# Patient Record
Sex: Female | Born: 1985 | Race: White | Hispanic: No | Marital: Married | Smoking: Current every day smoker
Health system: Southern US, Community
[De-identification: ages and names within clinical notes are randomized; demographics above are authoritative.]

## PROBLEM LIST (undated history)

## (undated) ENCOUNTER — Inpatient Hospital Stay: Payer: Self-pay

## (undated) DIAGNOSIS — G43909 Migraine, unspecified, not intractable, without status migrainosus: Secondary | ICD-10-CM

## (undated) DIAGNOSIS — K219 Gastro-esophageal reflux disease without esophagitis: Secondary | ICD-10-CM

## (undated) DIAGNOSIS — IMO0001 Reserved for inherently not codable concepts without codable children: Secondary | ICD-10-CM

## (undated) DIAGNOSIS — Z803 Family history of malignant neoplasm of breast: Secondary | ICD-10-CM

## (undated) DIAGNOSIS — Z8619 Personal history of other infectious and parasitic diseases: Secondary | ICD-10-CM

## (undated) DIAGNOSIS — O24419 Gestational diabetes mellitus in pregnancy, unspecified control: Secondary | ICD-10-CM

## (undated) DIAGNOSIS — Z8709 Personal history of other diseases of the respiratory system: Secondary | ICD-10-CM

## (undated) DIAGNOSIS — R42 Dizziness and giddiness: Secondary | ICD-10-CM

## (undated) HISTORY — DX: Migraine, unspecified, not intractable, without status migrainosus: G43.909

## (undated) HISTORY — DX: Gastro-esophageal reflux disease without esophagitis: K21.9

## (undated) HISTORY — DX: Reserved for inherently not codable concepts without codable children: IMO0001

## (undated) HISTORY — DX: Gestational diabetes mellitus in pregnancy, unspecified control: O24.419

## (undated) HISTORY — DX: Family history of malignant neoplasm of breast: Z80.3

## (undated) HISTORY — PX: LAPAROSCOPY: SHX197

## (undated) HISTORY — PX: DILATION AND CURETTAGE OF UTERUS: SHX78

---

## 2012-03-11 ENCOUNTER — Ambulatory Visit: Payer: Self-pay | Admitting: Obstetrics and Gynecology

## 2012-03-11 LAB — COMPREHENSIVE METABOLIC PANEL
Alkaline Phosphatase: 67 U/L (ref 50–136)
Bilirubin,Total: 0.3 mg/dL (ref 0.2–1.0)
Chloride: 106 mmol/L (ref 98–107)
Co2: 28 mmol/L (ref 21–32)
Creatinine: 0.62 mg/dL (ref 0.60–1.30)
EGFR (African American): >60
EGFR (Non-African Amer.): >60
Osmolality: 279 (ref 275–301)
SGOT(AST): 18 U/L (ref 15–37)
SGPT (ALT): 19 U/L (ref 12–78)
Sodium: 141 mmol/L (ref 136–145)

## 2012-03-11 LAB — CBC
HCT: 38.6 % (ref 35.0–47.0)
MCHC: 33.6 g/dL (ref 32.0–36.0)
Platelet: 283 10*3/uL (ref 150–440)
RDW: 14.3 % (ref 11.5–14.5)
WBC: 12.6 10*3/uL — ABNORMAL HIGH (ref 3.6–11.0)

## 2012-03-11 LAB — PREGNANCY, URINE: Pregnancy Test, Urine: NEGATIVE m[IU]/mL

## 2012-03-14 ENCOUNTER — Ambulatory Visit: Payer: Self-pay | Admitting: Obstetrics and Gynecology

## 2012-03-18 LAB — PATHOLOGY REPORT

## 2013-03-25 ENCOUNTER — Ambulatory Visit: Payer: Self-pay | Admitting: Obstetrics & Gynecology

## 2013-03-25 LAB — CBC
HGB: 13.1 g/dL (ref 12.0–16.0)
MCH: 31.1 pg (ref 26.0–34.0)
MCV: 91 fL (ref 80–100)
Platelet: 286 10*3/uL (ref 150–440)
RBC: 4.21 10*6/uL (ref 3.80–5.20)
RDW: 14.3 % (ref 11.5–14.5)
WBC: 12.9 10*3/uL — ABNORMAL HIGH (ref 3.6–11.0)

## 2013-03-25 LAB — COMPREHENSIVE METABOLIC PANEL
Albumin: 4.2 g/dL (ref 3.4–5.0)
Alkaline Phosphatase: 68 U/L (ref 50–136)
Anion Gap: 5 — ABNORMAL LOW (ref 7–16)
Bilirubin,Total: 0.4 mg/dL (ref 0.2–1.0)
Calcium, Total: 9.3 mg/dL (ref 8.5–10.1)
Co2: 26 mmol/L (ref 21–32)
Creatinine: 0.57 mg/dL — ABNORMAL LOW (ref 0.60–1.30)
EGFR (African American): >60
Glucose: 106 mg/dL — ABNORMAL HIGH (ref 65–99)
SGPT (ALT): 25 U/L (ref 12–78)
Sodium: 138 mmol/L (ref 136–145)
Total Protein: 7.7 g/dL (ref 6.4–8.2)

## 2013-03-27 LAB — PATHOLOGY REPORT

## 2013-06-11 HISTORY — PX: DILATION AND CURETTAGE OF UTERUS: SHX78
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
## 2014-09-28 NOTE — Op Note (Signed)
PATIENT NAME:  [name], [name] MR#:  [id number] DATE OF BIRTH:  07/11/1985  DATE OF PROCEDURE:  03/14/2012  PREOPERATIVE DIAGNOSES:  1. Abnormal uterine bleeding.  2. Chronic pelvic pain.   POSTOPERATIVE DIAGNOSIS:  1. Abnormal uterine bleeding.  2. Chronic pelvic pain.   PROCEDURES:  1. Diagnostic laparoscopy.  2. Dilation and curettage.  3. Hysteroscopy.   ANESTHESIA: General LMA.   SURGEON:  Stephen Jackson, M.D.   ESTIMATED BLOOD LOSS: Minimal.   OPERATIVE FLUIDS: 1000 mL crystalloid.   COMPLICATIONS: None.   FINDINGS:  1. Small cyst on left ovary, simple-appearing.  2. Small amount of scar tissue in cul-de-sac, potentially suggestive of endometriosis.  3. Small lesion on right ovary, powder burn appearance.  4. Normal-appearing uterine cavity with several very small polypoid lesions.   SPECIMENS:  1. Endometrial curettings.  2. Biopsy of pouch of Douglas peritoneum.   CONDITION AT END OF THE PROCEDURE: Stable.   INDICATIONS FOR PROCEDURE: [name] [name] is a 29-year-old female who presented to me in the clinic after being initially evaluated by Dr. Van Dalen for chronic pelvic pain as well as abnormal uterine bleeding. She underwent a saline infusion sonohysterogram which revealed several filling defects in the uterus which were suggestive of the polyps. With her history of pelvic pain and potentially polyps in her uterus, it was recommended that she undergo and a diagnostic laparoscopy as well as hysteroscopy with a dilation and curettage. She was sent to me by Dr. Van Dalen. I reviewed her history up to that point and discussed the recommended treatment. She was therefore taken to the operating room.   PROCEDURE IN DETAIL: After the patient was met in the preoperative area, she was taken to the operating room. She was placed under general anesthesia which was found to be adequate. She was prepped and draped in the usual sterile fashion in the dorsal supine lithotomy  position. After time-out was called, a sterile speculum was placed in the vagina and a single-tooth tenaculum was affixed to the anterior lip of the cervix. An acorn uterine manipulator was then affixed to the tenaculum. A Foley catheter was placed in her bladder.   Attention was turned to the abdomen where a 5-mm infraumbilical incision was made and the abdomen was entered using the Optiview direct trocar entry method. Entry was verified in the abdomen using opening abdominal pressure. The abdomen was then insufflated with CO2. After insufflation, the camera was reintroduced and atraumatic entry was verified. The abdomen and pelvis were surveyed with the above-noted findings. A peritoneal biopsy was taken through a left lower quadrant 5-mm port, which was also placed under direct intraabdominal camera visualization. After the biopsy there was hemostasis. The diagnostic laparoscopy portion was then concluded after the ports were removed. The abdomen was desufflated and the incisions were closed using a 3-0 Monocryl in a subcuticular fashion followed by Dermabond which reapproximated the skin.   Attention was turned to the hysteroscopy portion of the procedure. The uterine manipulator was removed and the cervix was dilated in a serial fashion using Hegar dilators to 8 mm. The hysteroscope was gently introduced through the cervix into the uterus with the above-noted findings. Curette was then used to sample the lining of the uterus. The hysteroscope was then reintroduced through the cervix to verify that no damage was done. The camera was then removed and the single-tooth tenaculum was removed from the anterior lip of the cervix with hemostasis noted. The catheter was also removed from her bladder.     The patient tolerated the procedure well. Sponge, lap, and needle counts were correct times two. The patient was awakened in the operating room and taken to the recovery area in stable fashion.      ____________________________ Stephen D. Jackson, MD sdj:bjt D: 03/15/2012 06:02:22 ET T: 03/15/2012 08:49:28 ET JOB#: [id number]  cc: Stephen D. Jackson, MD, <Dictator> STEPHEN D JACKSON MD ELECTRONICALLY SIGNED 04/16/2012 4:39 

## 2014-10-01 NOTE — Op Note (Signed)
PATIENT NAME:  [name] [name], [name] [name] MR#:  [id number] DATE OF BIRTH:  [birth year]  DATE OF PROCEDURE:  03/25/2013  PREOPERATIVE DIAGNOSIS: Left ectopic pregnancy.   POSTOPERATIVE DIAGNOSIS: Left ectopic pregnancy.   PROCEDURE: Operative laparoscopy with left salpingostomy.   SURGEON: Annamarie MajorPaul Tishawn Friedhoff, M.D.   ANESTHESIA: General.   ESTIMATED BLOOD LOSS: Minimal.   COMPLICATIONS: None.   FINDINGS: A left ampullary ectopic pregnancy, unruptured.   DISPOSITION: To recovery room stable.   TECHNIQUE: The patient is prepped and draped in the usual sterile fashion after adequate anesthesia is obtained in the dorsal lithotomy position. Bladder is drained with a Foley catheter and a sponge stick is placed in the vagina for manipulation purposes as needed.   Attention is then turned to the abdomen where a Veress needle is inserted through a 5 mm infraumbilical incision after Marcaine is used to anesthetize the skin. Veress needle placement is confirmed using the hanging drop technique and the abdomen is then insufflated with CO2 gas. A 5 mm trocar is inserted under direct visualization with the laparoscope, with no injuries or bleeding noted. The patient is placed in Trendelenburg positioning. A 5 mm trocar is placed in the right lower quadrant lateral to the inferior epigastric blood vessels, with no injuries or bleeding noted. The above-mentioned findings are visualized and the ampullary portion of the pelvic pregnancy carefully incised in a linear fashion with the fallopian tube's axis, and then removal of products of conception is performed with grasping forceps as well as suction irrigator. The area is then irrigated with repeat aspiration and excellent hemostasis is noted. The right fallopian tube is visualized to be completely normal in its entire length and the ovaries and uterus are also normal. There is no evidence for endometriosis visualized at this time. The liver, gallbladder, bowel, colon and ureter  are all observed to be normal and unharmed throughout the procedure.   Fluid is aspirated and then gas is expelled and then trocars are removed. The skin is closed with Dermabond. Catheter and sponge stick are removed from the vagina. The patient goes to the recovery room in stable condition. All sponge, instrument and needle counts are correct.    ____________________________ R. Annamarie MajorPaul Gaberiel Youngblood, MD rph:jm D: 03/25/2013 14:45:50 ET T: 03/25/2013 15:24:42 ET JOB#: [id number]  cc: Dierdre Searles. Paul Alondria Mousseau, MD, <Dictator> Nadara MustardOBERT P Arnie Maiolo MD ELECTRONICALLY SIGNED 03/26/2013 8:52

## 2014-10-25 ENCOUNTER — Encounter: Payer: 59 | Attending: Obstetrics and Gynecology | Admitting: Dietician

## 2014-10-25 ENCOUNTER — Encounter: Payer: Self-pay | Admitting: Dietician

## 2014-10-25 VITALS — BP 96/60 | Ht 64.0 in | Wt 139.7 lb

## 2014-10-25 DIAGNOSIS — O24410 Gestational diabetes mellitus in pregnancy, diet controlled: Secondary | ICD-10-CM | POA: Diagnosis not present

## 2014-10-25 NOTE — Progress Notes (Signed)
Diabetes Self-Management Education  Visit Type:    Appt. Start Time: 11am Appt. End Time: 12:30pm  10/25/2014  [name], identified by name and date of birth, is a 29 y.o. female with a diagnosis of Diabetes: Gestational Diabetes.  Other people present during visit:      ASSESSMENT  Blood pressure 96/60, height 5\' 4"  (1.626 m), weight 139 lb 11.2 oz (63.368 kg), last menstrual period 03/14/2014. Body mass index is 23.97 kg/(m^2).  Initial Visit Information:  Are you currently following a meal plan?: No   Are you taking your medications as prescribed?: Yes Are you checking your feet?: No   How often do you need to have someone help you when you read instructions, pamphlets, or other written materials from your doctor or pharmacy?: 1 - Never What is the last grade level you completed in school?: 12  Psychosocial:     Patient Belief/Attitude about Diabetes: Motivated to manage diabetes Self-care barriers: None (work) Patient Concerns: Nutrition/Meal planning, Monitoring Special Needs: None Preferred Learning Style: Auditory, Visual Learning Readiness: Ready  Complications:   How often do you check your blood sugar?: 0 times/day (not testing) Have you had a dilated eye exam in the past 12 months?: No Have you had a dental exam in the past 12 months?: No  Diet Intake:  Breakfast: meal times vary 6a-10a-eats out frequently Snack (morning): none Lunch: meal time varies 11a-2p-eats sweets daily-eats out frequently Snack (afternoon): none Dinner: mealtimes varies 6p-10p-eats out frequently Snack (evening): none  Exercise:  Exercise:  (active job-unloading trucks)  Individualized Plan for Diabetes Self-Management Training:   Learning Objective:  Patient will have a greater understanding of diabetes self-management.  Patient education plan per assessed needs and concerns is to attend individual sessions for     Education Topics Reviewed with Patient  Today:  Factors that contribute to the development of diabetes, Explored patient's options for treatment of their diabetes (definition of GDM) Role of diet in the treatment of diabetes and the relationship between the three main macronutrients and blood glucose level (portion sizes) Role of exercise on diabetes management, blood pressure control and cardiac health.  (discussed med options for treatment of GDM) Taught/evaluated SMBG meter., Purpose and frequency of SMBG., Taught/discussed recording of test results and interpretation of SMBG. Taught treatment of hypoglycemia - the 15 rule. Relationship between chronic complications and blood glucose control Role of stress on diabetes Reviewed with patient blood glucose goals with pregnancy, Pregnancy and GDM  Role of pre-pregnancy blood glucose control on the development of the fetus Lifestyle issues that need to be addressed for better diabetes care, Review risk of smoking and offered smoking cessation, Helped patient develop diabetes management plan for (enter comment)  PATIENTS GOALS/Plan (Developed by the patient):  Nutrition: Follow meal plan discussed, General guidelines for healthy choices and portions discussed Physical Activity: Exercise 5-7 days per week Medications: take my medication as prescribed Monitoring : test my blood glucose as discussed (note x per day with comment), test blood glucose pre and post meals as discussed Reducing Risk: stop smoking  Plan:   Patient Instructions  Read book on Gestational Diabetes Follow Gestational Meal Planning Sheet Complete a 3 day Food Record and bring to next appointment Check blood sugars first thing in the morning before eating and 2 hrs after every meal daily Call MD for prescription for meter strips and lancets Check blood sugars   4  times/day Strips (type)  One touch verio test strips; Lancets  One  touch delica lancets Stop smoking Exercise 20-30 minutes at least 5 x week Call  if questions [phone] Next appointment   11-02-14 at 9am   Expected Outcomes:  Demonstrated interest in learning. Expect positive outcomes  Education material provided: GDM booklet; One touch Verio meter  If problems or questions, patient to contact team via: [phone]  Future DSME appointment:  (11-02-14)

## 2014-10-25 NOTE — Patient Instructions (Signed)
Read book on Gestational Diabetes Follow Gestational Meal Planning Sheet Complete a 3 day Food Record and bring to next appointment Check blood sugars first thing in the morning before eating and 2 hrs after every meal daily Call MD for prescription for meter strips and lancets Check blood sugars   4  times/day Strips (type)  One touch verio test strips; Lancets  One touch delica lancets Stop smoking Exercise 20-30 minutes at least 5 x week Call if questions [phone] Next appointment   11-02-14 at [facility]

## 2014-11-02 ENCOUNTER — Encounter: Payer: 59 | Admitting: Dietician

## 2014-11-02 VITALS — BP 110/70 | Ht 64.0 in | Wt 137.2 lb

## 2014-11-02 DIAGNOSIS — O24410 Gestational diabetes mellitus in pregnancy, diet controlled: Secondary | ICD-10-CM

## 2014-11-02 NOTE — Patient Instructions (Signed)
Make sure to include a protein source with all meals. Avoid skipping meals (dinner). Call the [facility] with any questions or concerns.

## 2014-11-02 NOTE — Progress Notes (Signed)
Appt. Start Time: 0900 Appt. End Time: 1000  Patient's BG record indicates BGs well within goal ranges; 2 readings <70, but pt denies any low BG symptoms. Patient's food diary indicates some skipped meals due to work, fatigue. Some meals lack protein source.  Provided 1950kcal meal plan, and wrote individualized menus based on patient's food preferences. Instructed patient on food safety, including avoidance of Listeriosis, and limiting mercury from fish. Discussed importance of maintaining healthy lifestyle habits to reduce risk of Type 2 DM as well as Gestational DM with any future pregnancies. Advised patient to use any remaining testing supplies to test some BGs after delivery, and to have BG tested ideally annually, as well as prior to attempting future pregnancies.  GDM Class 2 Diabetes Overview - define DM; state own type of DM; identify functions of pancreas and insulin; define insulin deficiency vs insulin resistance  Psychosocial - identify DM as a source of stress; state the effects of stress on BG control; verbalize appropriate stress management techniques; identify personal stress issues   Nutritional Management - describe effects of food on blood glucose; identify sources of carbohydrate, protein and fat; verbalize the importance of balance meals in controlling blood glucose; identify meals as well balanced or not; estimate servings of carbohydrate from menus; use food labels to identify servings size, content of carbohydrate, fiber, protein, fat, saturated fat and sodium; recognize food sources of fat, saturated fat, trans fat, sodium and verbalize goals for intake; describe healthful appropriate food choices when dining out   Exercise - describe the effects of exercise on blood glucose and importance of regular exercise in controlling diabetes; state a plan for personal exercise; verbalize contraindications for exercise  Medications - state name, dose, timing of currently prescribed  medications; describe types of medications available for diabetes  Self-Monitoring - state importance of HBGM and demo procedure accurately; use HBGM results to effectively manage diabetes; identify importance of regular HbA1C testing and goals for results  Acute Complications/Sick Day Guidelines - recognize hyperglycemia and hypoglycemia with causes and effects; identify blood glucose results as high, low or in control; list steps in treating and preventing high and low blood glucose; state appropriate measure to manage blood glucose when ill (need for meds, HBGM plan, when to call physician, need for fluids)  Chronic Complications/Foot, Skin, Eye Dental Care - identify possible long-term complications of diabetes (retinopathy, neuropathy, nephropathy, cardiovascular disease, infections); explain steps in prevention and treatment of chronic complications; state importance of daily self-foot exams; describe how to examine feet and what to look for; explain appropriate eye and dental care  Lifestyle Changes/Goals & Health/Community Resources - state benefits of making appropriate lifestyle changes; identify habits that need to change (meals, tobacco, alcohol); identify strategies to reduce risk factors for personal health; set goals for proper diabetes care; state need for and frequency of healthcare follow-up; describe appropriate community resources for good health (ADA, web sites, apps)   Pregnancy/Sexual Health - define gestational diabetes; state importance of good blood glucose control and birth control prior to pregnancy; state importance of good blood glucose control in preventing sexual problems (impotence, vaginal dryness, infections, loss of desire); state relationship of blood glucose control and pregnancy outcome; describe risk of maternal and fetal complications  Teaching Materials Used: Meal Plan Food Lists Food Safety Preventing Diabetes

## 2014-12-09 ENCOUNTER — Inpatient Hospital Stay
Admission: EM | Admit: 2014-12-09 | Discharge: 2014-12-11 | DRG: 775 | Disposition: A | Discharge status: Home / Self Care | Payer: 59 | Attending: Obstetrics and Gynecology | Admitting: Obstetrics and Gynecology

## 2014-12-09 DIAGNOSIS — O24429 Gestational diabetes mellitus in childbirth, unspecified control: Secondary | ICD-10-CM | POA: Diagnosis present

## 2014-12-09 DIAGNOSIS — Z3A38 38 weeks gestation of pregnancy: Secondary | ICD-10-CM | POA: Diagnosis present

## 2014-12-09 DIAGNOSIS — O479 False labor, unspecified: Secondary | ICD-10-CM | POA: Diagnosis present

## 2014-12-09 LAB — CBC
HCT: 35.3 % (ref 35.0–47.0)
HCT: 35.6 % (ref 35.0–47.0)
HEMOGLOBIN: 11.6 g/dL — AB (ref 12.0–16.0)
Hemoglobin: 11.4 g/dL — ABNORMAL LOW (ref 12.0–16.0)
MCH: 27.9 pg (ref 26.0–34.0)
MCH: 28.2 pg (ref 26.0–34.0)
MCHC: 32.3 g/dL (ref 32.0–36.0)
MCHC: 32.6 g/dL (ref 32.0–36.0)
MCV: 86.5 fL (ref 80.0–100.0)
MCV: 86.7 fL (ref 80.0–100.0)
PLATELETS: 291 10*3/uL (ref 150–440)
PLATELETS: 297 10*3/uL (ref 150–440)
RBC: 4.09 MIL/uL (ref 3.80–5.20)
RBC: 4.1 MIL/uL (ref 3.80–5.20)
RDW: 13.4 % (ref 11.5–14.5)
RDW: 13.4 % (ref 11.5–14.5)
WBC: 19 10*3/uL — AB (ref 3.6–11.0)
WBC: 20.5 10*3/uL — ABNORMAL HIGH (ref 3.6–11.0)

## 2014-12-09 LAB — RAPID HIV SCREEN (HIV 1/2 AB+AG)
HIV 1/2 Antibodies: NONREACTIVE
HIV-1 P24 Antigen - HIV24: NONREACTIVE

## 2014-12-09 MED ORDER — IBUPROFEN 600 MG PO TABS
600.0000 mg | ORAL_TABLET | Freq: Four times a day (QID) | ORAL | Status: DC
  Administered 2014-12-09 – 2014-12-11 (×8): 600 mg via ORAL
  Filled 2014-12-09 (×8): qty 1

## 2014-12-09 MED ORDER — FERROUS SULFATE 325 (65 FE) MG PO TABS
325.0000 mg | ORAL_TABLET | Freq: Every day | ORAL | Status: DC
  Administered 2014-12-10 – 2014-12-11 (×2): 325 mg via ORAL
  Filled 2014-12-09 (×2): qty 1

## 2014-12-09 MED ORDER — BENZOCAINE-MENTHOL 20-0.5 % EX AERO: 1.0000 "application " | INHALATION_SPRAY | CUTANEOUS | Status: DC | PRN

## 2014-12-09 MED ORDER — PRENATAL 27-0.8 MG PO TABS: 1.0000 | ORAL_TABLET | Freq: Every day | ORAL | Status: DC

## 2014-12-09 MED ORDER — BENZOCAINE-MENTHOL 20-0.5 % EX AERO
1.0000 "application " | INHALATION_SPRAY | CUTANEOUS | Status: DC | PRN
  Filled 2014-12-09: qty 56

## 2014-12-09 MED ORDER — DOCUSATE SODIUM 100 MG PO CAPS
100.0000 mg | ORAL_CAPSULE | Freq: Two times a day (BID) | ORAL | Status: DC
  Administered 2014-12-09 – 2014-12-11 (×5): 100 mg via ORAL
  Filled 2014-12-09 (×4): qty 1

## 2014-12-09 MED ORDER — ZOLPIDEM TARTRATE 5 MG PO TABS
5.0000 mg | ORAL_TABLET | Freq: Every evening | ORAL | Status: DC | PRN
Start: 2014-12-09 — End: 2014-12-11

## 2014-12-09 MED ORDER — DOCUSATE SODIUM 100 MG PO CAPS: 100.0000 mg | ORAL_CAPSULE | Freq: Two times a day (BID) | ORAL | Status: DC

## 2014-12-09 MED ORDER — ONDANSETRON HCL 4 MG/2ML IJ SOLN: 4.0000 mg | INTRAMUSCULAR | Status: DC | PRN

## 2014-12-09 MED ORDER — PANTOPRAZOLE SODIUM 40 MG PO TBEC
40.0000 mg | DELAYED_RELEASE_TABLET | Freq: Every day | ORAL | Status: DC
  Administered 2014-12-10 – 2014-12-11 (×2): 40 mg via ORAL
  Filled 2014-12-09 (×2): qty 1

## 2014-12-09 MED ORDER — IBUPROFEN 600 MG PO TABS
ORAL_TABLET | ORAL | Status: AC
  Administered 2014-12-09: 600 mg via ORAL
  Filled 2014-12-09: qty 1

## 2014-12-09 MED ORDER — OXYCODONE-ACETAMINOPHEN 5-325 MG PO TABS
2.0000 | ORAL_TABLET | ORAL | Status: DC | PRN
  Administered 2014-12-10 – 2014-12-11 (×4): 2 via ORAL
  Filled 2014-12-09 (×5): qty 2

## 2014-12-09 MED ORDER — DIBUCAINE 1 % RE OINT: 1.0000 "application " | TOPICAL_OINTMENT | RECTAL | Status: DC | PRN

## 2014-12-09 MED ORDER — LANOLIN HYDROUS EX OINT: TOPICAL_OINTMENT | CUTANEOUS | Status: DC | PRN

## 2014-12-09 MED ORDER — OXYCODONE-ACETAMINOPHEN 5-325 MG PO TABS
1.0000 | ORAL_TABLET | ORAL | Status: DC | PRN
  Administered 2014-12-09 – 2014-12-10 (×5): 1 via ORAL
  Filled 2014-12-09 (×4): qty 1

## 2014-12-09 MED ORDER — ACETAMINOPHEN 325 MG PO TABS: 650.0000 mg | ORAL_TABLET | ORAL | Status: DC | PRN

## 2014-12-09 MED ORDER — OXYTOCIN 10 UNIT/ML IJ SOLN
10.0000 [IU] | Freq: Once | INTRAMUSCULAR | Status: AC
  Administered 2014-12-09: 10 [IU] via INTRAMUSCULAR

## 2014-12-09 MED ORDER — ONDANSETRON HCL 4 MG PO TABS
4.0000 mg | ORAL_TABLET | ORAL | Status: DC | PRN
Start: 2014-12-09 — End: 2014-12-11

## 2014-12-09 MED ORDER — MAGNESIUM HYDROXIDE 400 MG/5ML PO SUSP: 30.0000 mL | ORAL | Status: DC | PRN

## 2014-12-09 MED ORDER — OXYTOCIN 10 UNIT/ML IJ SOLN
10.0000 [IU] | Freq: Once | INTRAMUSCULAR | Status: DC
  Administered 2014-12-09: 10 [IU] via INTRAMUSCULAR

## 2014-12-09 MED ORDER — DIPHENHYDRAMINE HCL 25 MG PO CAPS: 25.0000 mg | ORAL_CAPSULE | Freq: Four times a day (QID) | ORAL | Status: DC | PRN

## 2014-12-09 MED ORDER — PRENATAL MULTIVITAMIN CH
1.0000 | ORAL_TABLET | Freq: Every day | ORAL | Status: DC
  Administered 2014-12-09 – 2014-12-11 (×3): 1 via ORAL
  Filled 2014-12-09 (×3): qty 1

## 2014-12-09 MED ORDER — WITCH HAZEL-GLYCERIN EX PADS: 1.0000 "application " | MEDICATED_PAD | CUTANEOUS | Status: DC | PRN

## 2014-12-09 MED ORDER — LACTATED RINGERS IV SOLN: INTRAVENOUS | Status: DC

## 2014-12-09 MED ORDER — IBUPROFEN 600 MG PO TABS: 600.0000 mg | ORAL_TABLET | Freq: Four times a day (QID) | ORAL | Status: DC

## 2014-12-09 MED ORDER — SIMETHICONE 80 MG PO CHEW: 80.0000 mg | CHEWABLE_TABLET | ORAL | Status: DC | PRN

## 2014-12-09 MED ORDER — PRENATAL MULTIVITAMIN CH: 1.0000 | ORAL_TABLET | Freq: Every day | ORAL | Status: DC

## 2014-12-09 MED ORDER — TETANUS-DIPHTH-ACELL PERTUSSIS 5-2.5-18.5 LF-MCG/0.5 IM SUSP
0.5000 mL | Freq: Once | INTRAMUSCULAR | Status: AC
  Administered 2014-12-11: 0.5 mL via INTRAMUSCULAR
  Filled 2014-12-09: qty 0.5

## 2014-12-09 MED ORDER — MORPHINE SULFATE 10 MG/ML IJ SOLN
INTRAMUSCULAR | Status: AC
  Administered 2014-12-09: 10 mg via INTRAMUSCULAR
  Filled 2014-12-09: qty 1

## 2014-12-09 MED ORDER — OXYCODONE-ACETAMINOPHEN 5-325 MG PO TABS: 1.0000 | ORAL_TABLET | ORAL | Status: DC | PRN

## 2014-12-09 MED ORDER — IBUPROFEN 600 MG PO TABS
600.0000 mg | ORAL_TABLET | Freq: Four times a day (QID) | ORAL | Status: DC
  Administered 2014-12-09: 600 mg via ORAL

## 2014-12-09 NOTE — H&P (Signed)
Obstetric History and Physical  [name] [name] is a 29 y.o. G2P0010 with Estimated Date of Delivery: 12/19/14 per LMP and 7 wk US who presents at 1215w4d  presenting for advanced labor. Pt was 8 cm on admission and quickly moved to complete dilation. Patient states she has been having regular contractions, no vaginal bleeding, intact membranes, with active fetal movement.    Prenatal Course Source of Care: [facility]  with onset of care at 7 weeks Pregnancy complications or risks:There are no active problems to display for this patient.    Prenatal labs and studies: ABO, Rh: O+  Antibody: neg Rubella: Immune Varicella: Immune RPR:  NR HBsAg:  neg HIV: neg GC/CT: neg/neg GBS: neg 1 hr Glucola: 206   Genetic screening: 1st trimester screen negative    Prenatal Transfer Tool   Past Medical History  Diagnosis Date  . Migraines   . Reflux     PSHx: Laparoscopy, hysteroscopy, D&C, oral surgery  OBHx: G2P0010 - h/o ectopic with salpingectomy   History   Social History  . Marital Status: Single    Spouse Name: N/A  . Number of Children: N/A  . Years of Education: N/A   Social History Main Topics  . Smoking status: Current Every Day Smoker -- 15 years  . Smokeless tobacco: Not on file     Comment: trying to quit-smokes 1-3 cigarettes/day  . Alcohol Use: No  . Drug Use: No  . Sexual Activity: Not on file   Other Topics Concern  . Not on file   Social History Narrative  . No narrative on file    No family history on file.  Prescriptions prior to admission  Medication Sig Dispense Refill Last Dose  . acetaminophen (TYLENOL) 325 MG tablet Take 650 mg by mouth every 6 (six) hours as needed for mild pain or moderate pain.   Taking  . omeprazole (PRILOSEC) 20 MG capsule Take 20 mg by mouth daily.   Taking  . ONETOUCH DELICA LANCETS FINE MISC   1 Taking  . ONETOUCH VERIO test strip   1 Taking  . Prenatal Vit-Fe Fumarate-FA (MULTIVITAMIN-PRENATAL) 27-0.8 MG TABS tablet  Take 1 tablet by mouth daily at 12 noon.   Taking    Allergies  Allergen Reactions  . Latex Rash  . Sulfa Antibiotics Rash    Review of Systems: Negative except for what is mentioned in HPI.  Physical Exam: LMP 03/14/2014 GENERAL: Well-developed, well-nourished female in no acute distress.  ABDOMEN: Soft, nontender, nondistended, gravid. EXTREMITIES: Nontender, no edema Cervical Exam: Dilatation 8 cm   Effacement 100 %   Station 0   Presentation: cephalic FHT: + FHTs on admission, delivery shortly after   Pertinent Labs/Studies:   No results found for this or any previous visit (from the past 24 hour(s)).  Assessment : IUP at 8615w4d in advanced labor   Plan: Admit for labor and delivery See delivery note

## 2014-12-09 NOTE — Progress Notes (Signed)
OB Note CTSP to repair her lacerations. Patient had uncomplicated SVD and on inspection she had b/l labia majora tears and a 1st degree perineal laceration. After injection of 20mL of 1% lidocaine into the three areas, the bilateral l. Majora tears were repaired with 3-0 vicryl in a running non locking fashion. The 1st degree perineal was then repaired with a 0-vicryl in a running non locking fashion.  See delivery of CNM Brothers for full details.   Cornelia Copaharlie Jaquail Mclees, Jr MD [facility]  Pager: [phone]

## 2014-12-10 LAB — RPR: RPR: NONREACTIVE

## 2014-12-10 NOTE — Progress Notes (Signed)
  Subjective:  Doing well, minimal lcohia  Objective:   Blood pressure 113/63, pulse 68, temperature 97.6 F (36.4 C), temperature source Oral, resp. rate 16, height 5\' 4"  (1.626 m), weight 60.782 kg (134 lb), last menstrual period 03/14/2014, SpO2 100 %.  General: NAD Pulmonary: no increased work of breathing Abdomen: non-distended, non-tender, fundus firm at level of umbilicus Extremities: no edema, no erythema, no tenderness  Results for orders placed or performed during the hospital encounter of 12/09/14 (from the past 72 hour(s))  RPR     Status: None   Collection Time: 12/09/14  8:36 AM  Result Value Ref Range   RPR Ser Ql Non Reactive Non Reactive    Comment: (NOTE) Performed At: [facility] [place] [address] [place], [place] [id number] Mila HomerHancock William F MD [phone]   CBC     Status: Abnormal   Collection Time: 12/09/14  8:36 AM  Result Value Ref Range   WBC 19.0 (H) 3.6 - 11.0 K/uL   RBC 4.09 3.80 - 5.20 MIL/uL   Hemoglobin 11.4 (L) 12.0 - 16.0 g/dL   HCT 95.635.3 21.335.0 - 08.647.0 %   MCV 86.5 80.0 - 100.0 fL   MCH 27.9 26.0 - 34.0 pg   MCHC 32.3 32.0 - 36.0 g/dL   RDW 57.813.4 46.911.5 - 62.914.5 %   Platelets 291 150 - 440 K/uL  Rapid HIV screen (HIV 1/2 Ab+Ag)     Status: None   Collection Time: 12/09/14  8:36 AM  Result Value Ref Range   HIV-1 P24 Antigen - HIV24 NON REACTIVE NON REACTIVE   HIV 1/2 Antibodies NON REACTIVE NON REACTIVE   Interpretation (HIV Ag Ab)      A non reactive test result means that HIV 1 or HIV 2 antibodies and HIV 1 p24 antigen were not detected in the specimen.  CBC     Status: Abnormal   Collection Time: 12/09/14 10:38 AM  Result Value Ref Range   WBC 20.5 (H) 3.6 - 11.0 K/uL   RBC 4.10 3.80 - 5.20 MIL/uL   Hemoglobin 11.6 (L) 12.0 - 16.0 g/dL   HCT 52.835.6 41.335.0 - 24.447.0 %   MCV 86.7 80.0 - 100.0 fL   MCH 28.2 26.0 - 34.0 pg   MCHC 32.6 32.0 - 36.0 g/dL   RDW 01.013.4 27.211.5 - 53.614.5 %   Platelets 297 150 - 440 K/uL    Assessment:   29 y.o. G1P0  postpartum day # 1 TSVD  Plan:    1) Continue routine postpartum care  2) Disposition aniticpate D/C PPD2

## 2014-12-11 MED ORDER — OXYCODONE-ACETAMINOPHEN 5-325 MG PO TABS: 2.0000 | ORAL_TABLET | ORAL | Status: DC | PRN

## 2014-12-11 MED ORDER — IBUPROFEN 600 MG PO TABS: 600.0000 mg | ORAL_TABLET | Freq: Four times a day (QID) | ORAL | Status: DC

## 2014-12-11 NOTE — Progress Notes (Signed)
Pt discharged at this time; RN escorted pt and family to door

## 2014-12-11 NOTE — Discharge Instructions (Signed)
Discharge instructions:  ° °Call office if you have any of the following: headache, visual changes, fever >100 F, chills, breast concerns, excessive vaginal bleeding, incision drainage or problems, leg pain or redness, depression or any other concerns.  ° °Activity: Do not lift > 10 lbs for 6 weeks.  °No intercourse or tampons for 6 weeks.  °No driving for 1-2 weeks.  ° °

## 2014-12-11 NOTE — Discharge Summary (Signed)
Obstetric Discharge Summary Reason for Admission: onset of labor Delivery Type: spontaneous vaginal delivery Postpartum Procedures: none Complications-Intrapartum or Postpartum: none HEMOGLOBIN  Date Value Ref Range Status  12/09/2014 11.6* 12.0 - 16.0 g/dL Final   HGB  Date Value Ref Range Status  03/25/2013 13.1 12.0-16.0 g/dL Final   HCT  Date Value Ref Range Status  12/09/2014 35.6 35.0 - 47.0 % Final  03/25/2013 38.2 35.0-47.0 % Final    Gestational Age at Delivery: 6560w6d  Antepartum complications: gestational diabetes Date of Delivery: 12/09/14  Delivered By: Donnalee CurryKB, CNM Delivery Type: spontaneous vaginal delivery  Physical Exam:  General: alert Lochia: appropriate Uterine Fundus: firm Incision: healing well DVT Evaluation: No evidence of DVT seen on physical exam. Abdomen: abdomen is soft without significant tenderness, masses, organomegaly or guarding  Rubella: Immune Varicella: Immune TDAP: Given postpartum Contraception: declines  Discharge Diagnoses: Term Pregnancy-delivered  Discharge Information: Date: 12/11/2014 Activity: pelvic rest Diet: routine Medications: Ibuprofen and Percocet Condition: stable Instructions:  Discharge instructions:   Call office if you have any of the following: headache, visual changes, fever >100 F, chills, breast concerns, excessive vaginal bleeding, incision drainage or problems, leg pain or redness, depression or any other concerns.   Activity: Do not lift > 10 lbs for 6 weeks.  No intercourse or tampons for 6 weeks.  No driving for 1-2 weeks.   Discharge to: home Follow-up Information    Follow up with Marta AntuBrothers, Peirce Deveney, CNM. Schedule an appointment as soon as possible for a visit in 6 weeks.   Specialty:  Certified Nurse Midwife   Why:  Postpartum check   Contact information:   [address] [place] [address] [phone]        Newborn Data: Live born female  Birth Weight: 5 lb 9.6 oz (2540  g) APGAR: 8, 9  Home with mother.  Marta AntuBrothers, Frank Pilger, [place] 12/11/2014, 11:06 AM

## 2014-12-19 ENCOUNTER — Inpatient Hospital Stay: Admit: 2014-12-19 | Payer: 59

## 2015-10-04 ENCOUNTER — Emergency Department
Admission: EM | Admit: 2015-10-04 | Discharge: 2015-10-04 | Disposition: A | Discharge status: Home / Self Care | Payer: Medicaid Other | Attending: Emergency Medicine | Admitting: Emergency Medicine

## 2015-10-04 ENCOUNTER — Encounter: Payer: Self-pay | Admitting: *Deleted

## 2015-10-04 DIAGNOSIS — F17210 Nicotine dependence, cigarettes, uncomplicated: Secondary | ICD-10-CM | POA: Insufficient documentation

## 2015-10-04 DIAGNOSIS — R531 Weakness: Secondary | ICD-10-CM | POA: Diagnosis present

## 2015-10-04 DIAGNOSIS — Z791 Long term (current) use of non-steroidal anti-inflammatories (NSAID): Secondary | ICD-10-CM | POA: Diagnosis not present

## 2015-10-04 DIAGNOSIS — N926 Irregular menstruation, unspecified: Secondary | ICD-10-CM | POA: Insufficient documentation

## 2015-10-04 LAB — BASIC METABOLIC PANEL
ANION GAP: 10 (ref 5–15)
BUN: 13 mg/dL (ref 6–20)
CHLORIDE: 105 mmol/L (ref 101–111)
CO2: 21 mmol/L — AB (ref 22–32)
Calcium: 9.8 mg/dL (ref 8.9–10.3)
Creatinine, Ser: 0.65 mg/dL (ref 0.44–1.00)
GFR calc Af Amer: >60 mL/min (ref >60)
Glucose, Bld: 104 mg/dL — ABNORMAL HIGH (ref 65–99)
POTASSIUM: 4.1 mmol/L (ref 3.5–5.1)
Sodium: 136 mmol/L (ref 135–145)

## 2015-10-04 LAB — CBC
HEMATOCRIT: 39.1 % (ref 35.0–47.0)
HEMOGLOBIN: 13.3 g/dL (ref 12.0–16.0)
MCH: 30.4 pg (ref 26.0–34.0)
MCHC: 34.1 g/dL (ref 32.0–36.0)
MCV: 89.2 fL (ref 80.0–100.0)
Platelets: 307 10*3/uL (ref 150–440)
RBC: 4.39 MIL/uL (ref 3.80–5.20)
RDW: 13.7 % (ref 11.5–14.5)
WBC: 12.1 10*3/uL — ABNORMAL HIGH (ref 3.6–11.0)

## 2015-10-04 LAB — URINALYSIS COMPLETE WITH MICROSCOPIC (ARMC ONLY)
BACTERIA UA: NONE SEEN
Bilirubin Urine: NEGATIVE
GLUCOSE, UA: NEGATIVE mg/dL
HGB URINE DIPSTICK: NEGATIVE
Ketones, ur: NEGATIVE mg/dL
Leukocytes, UA: NEGATIVE
NITRITE: NEGATIVE
Protein, ur: NEGATIVE mg/dL
Specific Gravity, Urine: 1.004 — ABNORMAL LOW (ref 1.005–1.030)
pH: 6 (ref 5.0–8.0)

## 2015-10-04 LAB — WET PREP, GENITAL
Clue Cells Wet Prep HPF POC: NONE SEEN
SPERM: NONE SEEN
Trich, Wet Prep: NONE SEEN
YEAST WET PREP: NONE SEEN

## 2015-10-04 LAB — HCG, QUANTITATIVE, PREGNANCY: hCG, Beta Chain, Quant, S: <1 m[IU]/mL (ref <5)

## 2015-10-04 LAB — CHLAMYDIA/NGC RT PCR (ARMC ONLY)
Chlamydia Tr: NOT DETECTED
N gonorrhoeae: NOT DETECTED

## 2015-10-04 LAB — POCT PREGNANCY, URINE: PREG TEST UR: NEGATIVE

## 2015-10-04 NOTE — ED Provider Notes (Signed)
[facility] Emergency Department Provider Note  ____________________________________________   I have reviewed the triage vital signs and the nursing notes.   HISTORY  Chief Complaint Weakness and Abdominal Pain    HPI [name] [name] is a 30 y.o. female who has a history of ectopic, she is G2 P1, she did have surgery but had retained tubes in appropriate, she did give birth 10 months ago, and has irregular periods since that time. She stopped breast-feeding about 3-4 weeks ago. She states she has had irregular bleeding since the pregnancy but was starting to regularize her period until she stopped breast feeding. She had a normal period about 6 weeks ago and then had some bleeding that was less than normal about 5 weeks ago, now is having some mild cramping and she is concerned of possibly she is having an ectopic. Patient had a negative pregnancy test at home. She said no nausea vomiting or diarrhea. She does have cramping and sometimes goes to the back. She denies dysuria or urinary frequency. She is very concerned about the possibility of ectopic pregnancy.      Past Medical History  Diagnosis Date  . Migraines   . Reflux     Patient Active Problem List   Diagnosis Date Noted  . Labor and delivery, indication for care 12/09/2014  . Vaginal delivery 12/09/2014    History reviewed. No pertinent past surgical history.  Current Outpatient Rx  Name  Route  Sig  Dispense  Refill  . ibuprofen (ADVIL,MOTRIN) 600 MG tablet   Oral   Take 1 tablet (600 mg total) by mouth every 6 (six) hours.   30 tablet   0   . omeprazole (PRILOSEC) 40 MG capsule   Oral   Take 1 capsule by mouth daily.      10   . oxyCODONE-acetaminophen (PERCOCET/ROXICET) 5-325 MG per tablet   Oral   Take 2 tablets by mouth every 4 (four) hours as needed (for pain scale greater than 7).   30 tablet   0   . Prenatal Vit-Fe Fumarate-FA (MULTIVITAMIN-PRENATAL) 27-0.8 MG TABS  tablet   Oral   Take 1 tablet by mouth daily at 12 noon.           Allergies Latex and Sulfa antibiotics  History reviewed. No pertinent family history.  Social History Social History  Substance Use Topics  . Smoking status: Current Every Day Smoker -- 15 years  . Smokeless tobacco: None     Comment: trying to quit-smokes 1-3 cigarettes/day  . Alcohol Use: No    Review of Systems Constitutional: No fever/chills Eyes: No visual changes. ENT: No sore throat. No stiff neck no neck pain Cardiovascular: Denies chest pain. Respiratory: Denies shortness of breath. Gastrointestinal:   no vomiting.  No diarrhea.  No constipation. Genitourinary: Negative for dysuria. Musculoskeletal: Negative lower extremity swelling Skin: Negative for rash. Neurological: Negative for headaches, focal weakness or numbness. 10-point ROS otherwise negative.  ____________________________________________   PHYSICAL EXAM:  VITAL SIGNS: ED Triage Vitals  Enc Vitals Group     BP 10/04/15 1314 118/69 mmHg     Pulse Rate 10/04/15 1314 95     Resp 10/04/15 1314 18     Temp 10/04/15 1314 99.5 F (37.5 C)     Temp Source 10/04/15 1314 Oral     SpO2 10/04/15 1314 100 %     Weight 10/04/15 1314 145 lb (65.772 kg)     Height 10/04/15 1314  (1.626 m)  Head Cir --      Peak Flow --      Pain Score 10/04/15 1314 0     Pain Loc --      Pain Edu? --      Excl. in GC? --     Constitutional: Alert and oriented. Well appearing and in no acute distress. Eyes: Conjunctivae are normal. PERRL. EOMI. Head: Atraumatic. Nose: No congestion/rhinnorhea. Mouth/Throat: Mucous membranes are moist.  Oropharynx non-erythematous. Neck: No stridor.   Nontender with no meningismus Cardiovascular: Normal rate, regular rhythm. Grossly normal heart sounds.  Good peripheral circulation. Respiratory: Normal respiratory effort.  No retractions. Lungs CTAB. Abdominal: Soft and nontender. No distention. No guarding  no rebound Back:  There is no focal tenderness or step off there is no midline tenderness there are no lesions noted. there is no CVA tenderness Pelvic exam: Female nurse chaperone present, no external lesions noted, physiologic vaginal discharge noted with no purulent discharge, no cervical motion tenderness, no adnexal tenderness or mass, there is no significant uterine tenderness or mass. No vaginal bleeding Musculoskeletal: No lower extremity tenderness. No joint effusions, no DVT signs strong distal pulses no edema Neurologic:  Normal speech and language. No gross focal neurologic deficits are appreciated.  Skin:  Skin is warm, dry and intact. No rash noted. Psychiatric: Mood and affect are normal. Speech and behavior are normal.  ____________________________________________   LABS (all labs ordered are listed, but only abnormal results are displayed)  Labs Reviewed  BASIC METABOLIC PANEL - Abnormal; Notable for the following:    CO2 21 (*)    Glucose, Bld 104 (*)    All other components within normal limits  CBC - Abnormal; Notable for the following:    WBC 12.1 (*)    All other components within normal limits  URINALYSIS COMPLETEWITH MICROSCOPIC ([facility] ONLY) - Abnormal; Notable for the following:    Color, Urine STRAW (*)    APPearance CLEAR (*)    Specific Gravity, Urine 1.004 (*)    Squamous Epithelial / LPF 0-5 (*)    All other components within normal limits  CHLAMYDIA/NGC RT PCR ([facility] ONLY)  WET PREP, GENITAL  HCG, QUANTITATIVE, PREGNANCY  POC URINE PREG, ED  POCT PREGNANCY, URINE   ____________________________________________  EKG  I personally interpreted any EKGs ordered by me or triage  ____________________________________________  RADIOLOGY  I reviewed any imaging ordered by me or triage that were performed during my shift and, if possible, patient and/or family made aware of any abnormal  findings. ____________________________________________   PROCEDURES  Procedure(s) performed: None  Critical Care performed: None  ____________________________________________   INITIAL IMPRESSION / ASSESSMENT AND PLAN / ED COURSE  Pertinent labs & imaging results that were available during my care of the patient were reviewed by me and considered in my medical decision making (see chart for details).  Patient with normal pelvic exam, and irregular periods since her childbirth. She is not pregnant and her Sharene ButtersQuant is negative. There is no evidence of this time of STI, there is no evidence of significant UTI, there is no evidence of ectopic pregnancy or ovarian cyst. Patient is likely just regularizing her menses after childbirth however, we will check a gonorrhea and chlamydia as well as a wet prep although again very low suspicion for any of these entities. Patient is monogamous and does not feel any chance of exposure and she is not having any significant vaginal discharge or other complaining about Friday. Patient is largely here because she  is very anxious about the recurrence of ectopic pregnancy given irregular periods. She is very reassured that she is not pregnant. She is now ready for discharge and getting dressed. ____________________________________________   FINAL CLINICAL IMPRESSION(S) / ED DIAGNOSES  Final diagnoses:  None      This chart was dictated using voice recognition software.  Despite best efforts to proofread,  errors can occur which can change meaning.     Jeanmarie Plant, MD 10/04/15 [phone]

## 2015-10-04 NOTE — ED Notes (Addendum)
States abd cramping 1 week ago when her period should have ended, states some vaginal bleeding, states hx of eptopic pregnancy and feels like this is the same pain, states back pain and weakness and dizziness, states she has had negative preg test at home

## 2015-10-04 NOTE — ED Notes (Signed)
Pt informed to return if any life threatening symptoms occur.  

## 2015-11-28 ENCOUNTER — Emergency Department
Admission: EM | Admit: 2015-11-28 | Discharge: 2015-11-28 | Disposition: A | Discharge status: Home / Self Care | Payer: Medicaid Other | Attending: Emergency Medicine | Admitting: Emergency Medicine

## 2015-11-28 ENCOUNTER — Encounter: Payer: Self-pay | Admitting: Emergency Medicine

## 2015-11-28 DIAGNOSIS — M779 Enthesopathy, unspecified: Secondary | ICD-10-CM | POA: Diagnosis not present

## 2015-11-28 DIAGNOSIS — F17210 Nicotine dependence, cigarettes, uncomplicated: Secondary | ICD-10-CM | POA: Insufficient documentation

## 2015-11-28 DIAGNOSIS — R6884 Jaw pain: Secondary | ICD-10-CM | POA: Diagnosis present

## 2015-11-28 DIAGNOSIS — Z791 Long term (current) use of non-steroidal anti-inflammatories (NSAID): Secondary | ICD-10-CM | POA: Insufficient documentation

## 2015-11-28 DIAGNOSIS — M2669 Other specified disorders of temporomandibular joint: Secondary | ICD-10-CM | POA: Insufficient documentation

## 2015-11-28 MED ORDER — PREDNISONE 10 MG PO TABS: 10.0000 mg | ORAL_TABLET | Freq: Every day | ORAL | Status: DC

## 2015-11-28 MED ORDER — PREDNISONE 20 MG PO TABS
60.0000 mg | ORAL_TABLET | Freq: Once | ORAL | Status: AC
  Administered 2015-11-28: 60 mg via ORAL
  Filled 2015-11-28: qty 3

## 2015-11-28 NOTE — ED Provider Notes (Signed)
CSN: [id number]     Arrival date & time 11/28/15  1811 History   First MD Initiated Contact with Patient 11/28/15 1940     Chief Complaint  Patient presents with  . Insect Bite     (Consider location/radiation/quality/duration/timing/severity/associated sxs/prior Treatment) HPI  30 year old female presents to the emergency department for evaluation of pain and swelling and stiffness. Patient points to her right TMJ joint and states Sunday morning, one day ago she woke up with significant pain and swelling was unable to close her right jaw due to pain. She treated herself with ice and ibuprofen, this did improve. She denies having any headaches, vision changes, neck pain, fevers. No difficulty swallowing. She does have pain with chewing. She denies seeing or feeling any type of insect bites. There is been no redness. Pain today is 4 out of 10, she is 50% better over the last 24 hours with ibuprofen 600 mg twice a day. She denies any chest pain shortness of breath or abdominal pain.  Past Medical History  Diagnosis Date  . Migraines   . Reflux    History reviewed. No pertinent past surgical history. No family history on file. Social History  Substance Use Topics  . Smoking status: Current Every Day Smoker -- 15 years  . Smokeless tobacco: None     Comment: trying to quit-smokes 1-3 cigarettes/day  . Alcohol Use: No   OB History    Gravida Para Term Preterm AB TAB SAB Ectopic Multiple Living   1              Review of Systems  Constitutional: Negative for fever, chills, activity change and fatigue.  HENT: Negative for congestion, sinus pressure and sore throat.   Eyes: Negative for visual disturbance.  Respiratory: Negative for cough, chest tightness and shortness of breath.   Cardiovascular: Negative for chest pain and leg swelling.  Gastrointestinal: Negative for nausea, vomiting, abdominal pain and diarrhea.  Genitourinary: Negative for dysuria.  Musculoskeletal: Positive for  arthralgias (right TMJ). Negative for gait problem.  Skin: Negative for rash.  Neurological: Negative for weakness, numbness and headaches.  Hematological: Negative for adenopathy.  Psychiatric/Behavioral: Negative for behavioral problems, confusion and agitation.      Allergies  Latex and Sulfa antibiotics  Home Medications   Prior to Admission medications   Medication Sig Start Date End Date Taking? Authorizing Provider  ibuprofen (ADVIL,MOTRIN) 600 MG tablet Take 1 tablet (600 mg total) by mouth every 6 (six) hours. 12/11/14   Marta Antuamara Brothers, CNM  omeprazole (PRILOSEC) 40 MG capsule Take 1 capsule by mouth daily. 11/27/14   Historical Provider, MD  oxyCODONE-acetaminophen (PERCOCET/ROXICET) 5-325 MG per tablet Take 2 tablets by mouth every 4 (four) hours as needed (for pain scale greater than 7). 12/11/14   Marta Antuamara Brothers, CNM  predniSONE (DELTASONE) 10 MG tablet Take 1 tablet (10 mg total) by mouth daily. 6,5,4,3,2,1 six day taper 11/28/15   Evon Slackhomas C Gaines, PA-C  Prenatal Vit-Fe Fumarate-FA (MULTIVITAMIN-PRENATAL) 27-0.8 MG TABS tablet Take 1 tablet by mouth daily at 12 noon.    Historical Provider, MD   BP 122/64 mmHg  Pulse 81  Temp(Src) 98.9 F (37.2 C) (Oral)  Resp 20  Ht 5\' 4"  (1.626 m)  Wt 56.7 kg  BMI 21.45 kg/m2  SpO2 100%  LMP  (LMP Unknown) Physical Exam  Constitutional: She is oriented to person, place, and time. She appears well-developed and well-nourished. No distress.  HENT:  Head: Normocephalic and atraumatic.  Right Ear: External  ear normal.  Left Ear: External ear normal.  Nose: Nose normal.  Mouth/Throat: Oropharynx is clear and moist. No oropharyngeal exudate.  Examination of the right face shows mild swelling along the TMJ joint. She is point tender over the TMJ and has pain with range of motion of the right jaw. There is no erythema warmth or induration. External ears normal. No sign of peritonsillar abscess or uvular shifting. No cervical  lymphadenopathy.  Eyes: EOM are normal. Pupils are equal, round, and reactive to light. Right eye exhibits no discharge. Left eye exhibits no discharge.  Neck: Normal range of motion. Neck supple.  Cardiovascular: Normal rate, regular rhythm and intact distal pulses.   Pulmonary/Chest: Effort normal and breath sounds normal. No respiratory distress. She exhibits no tenderness.  Abdominal: Soft. She exhibits no distension. There is no tenderness.  Musculoskeletal: Normal range of motion. She exhibits no edema.  Neurological: She is alert and oriented to person, place, and time. She has normal reflexes.  Skin: Skin is warm and dry.  Psychiatric: She has a normal mood and affect. Her behavior is normal. Thought content normal.    ED Course  Procedures (including critical care time) Labs Review Labs Reviewed - No data to display  Imaging Review No results found. I have personally reviewed and evaluated these images and lab results as part of my medical decision-making.   EKG Interpretation None      MDM   Final diagnoses:  TMJ capsulitis    30 year old female with right-sided facial swelling, pain, stiffness. Symptoms 50% better with ibuprofen 600 mg 2. She continues to have pain on exam point tender over the right TMJ with no signs of cellulitis or infection. Vital signs are stable, she has been afebrile. Symptoms consistent with TMJ capsulitis. She is placed on a 6 day steroid taper. We'll resume ibuprofen after prednisone.    Evon Slack, PA-C 11/28/15 2000  Maurilio Lovely, MD 11/28/15 [phone]

## 2015-11-28 NOTE — ED Notes (Signed)
Pt presents with possible spider bite to behind right ear noticed on Sunday.

## 2015-11-28 NOTE — Discharge Instructions (Signed)

## 2015-11-28 NOTE — ED Notes (Signed)
Pt reports she woke up on Saturday with stiffness, swelling and pressure along her sinuses. Pt states she thinks she may have been bit by an insect, no redness noted on assessment. Pt states she has been taking ibuprofen and using ice packs to ease pain.

## 2016-06-11 NOTE — L&D Delivery Note (Signed)
Delivery Note At 6:29 AM a viable female was delivered via Vaginal, Spontaneous Delivery (Presentation: LOA).  APGAR: 8, 9; weight 6 lb 5.9 oz (2890 g).   Placenta status: delivered spontaneously, intact.  Cord: 3VC with the following complications: None.  Cord pH: n/a  Anesthesia: none  Episiotomy: None Lacerations: 1st degree;Vaginal Suture Repair: no repair Est. Blood Loss (mL): 400  Mom to postpartum.  Baby to Couplet care / Skin to Skin.  Called to see patient.  Mom pushed to deliver a viable female infant.  The head followed by shoulders, which delivered without difficulty, and the rest of the body.  A single tight nuchal cord noted and delivered through.  Baby to mom's chest.  Cord clamped and cut after > 1 min delay.  Cord blood obtained.  Placenta delivered spontaneously, intact, with a 3-vessel cord.  First degree vaginal laceration not repair as it was hemostatic with a short duration of holding pressure.  All counts correct.  Hemostasis obtained with IV pitocin and fundal massage. EBL 400 mL.     Thomasene Mohair, MD 03/24/2017, 6:52 AM

## 2016-09-10 ENCOUNTER — Encounter: Payer: Self-pay | Admitting: Advanced Practice Midwife

## 2016-09-14 ENCOUNTER — Telehealth: Payer: Self-pay

## 2016-09-14 NOTE — Telephone Encounter (Signed)
Adv. Pt e.s. Tylenol, 64oz water, 16oz caffiene a day, may hold ice to area and lie in a dark room.  Pt states she has done all of this.  Hx of migraine.  H/a gets worse and subsides.  Has tried these measures and still has h/a.  Adv no appts available for earlier appt for NOB.  Adv measures may not make it go away completely but at least make more tolerable.  May need to go to ED or primary.

## 2016-09-14 NOTE — Telephone Encounter (Signed)
Pt states she is [redacted] wks pregnant & has had a headache for 5 days. She has not been seen yet for this pregnancy. She is scheduled 10/01/16 w/[facility].

## 2016-10-01 ENCOUNTER — Encounter: Payer: Self-pay | Admitting: Obstetrics & Gynecology

## 2016-10-01 ENCOUNTER — Ambulatory Visit (INDEPENDENT_AMBULATORY_CARE_PROVIDER_SITE_OTHER): Payer: Medicaid Other | Admitting: Obstetrics & Gynecology

## 2016-10-01 ENCOUNTER — Other Ambulatory Visit (INDEPENDENT_AMBULATORY_CARE_PROVIDER_SITE_OTHER): Payer: Medicaid Other

## 2016-10-01 VITALS — BP 100/60 | Wt 150.0 lb

## 2016-10-01 DIAGNOSIS — Z3A14 14 weeks gestation of pregnancy: Secondary | ICD-10-CM

## 2016-10-01 DIAGNOSIS — N926 Irregular menstruation, unspecified: Secondary | ICD-10-CM | POA: Diagnosis not present

## 2016-10-01 DIAGNOSIS — Z3490 Encounter for supervision of normal pregnancy, unspecified, unspecified trimester: Secondary | ICD-10-CM | POA: Insufficient documentation

## 2016-10-01 DIAGNOSIS — Z124 Encounter for screening for malignant neoplasm of cervix: Secondary | ICD-10-CM

## 2016-10-01 DIAGNOSIS — Z131 Encounter for screening for diabetes mellitus: Secondary | ICD-10-CM

## 2016-10-01 DIAGNOSIS — Z113 Encounter for screening for infections with a predominantly sexual mode of transmission: Secondary | ICD-10-CM

## 2016-10-01 LAB — HM PAP SMEAR

## 2016-10-01 NOTE — Progress Notes (Signed)
10/01/2016   Chief Complaint: Missed period  Transfer of Care Patient: no  History of Present Illness: [name] is a 31 y.o. G3P1011 [redacted]w[redacted]d based on Patient's last menstrual period was 06/19/2016. with an Estimated Date of Delivery: 03/26/17, with the above CC. Preg complicated by has Labor and delivery, indication for care and Vaginal delivery on her problem list..  Her periods were: irregular periods from 31 to 36 days She was using no method when she conceived.  She has Negative signs or symptoms of nausea/vomiting of pregnancy. She has Negative signs or symptoms of miscarriage or preterm labor She identifies Negative Zika risk factors for her and her partner On any different medications around the time she conceived/early pregnancy: No  History of varicella: Yes   ROS: A 12-point review of systems was performed and negative, except as stated in the above HPI.  OBGYN History: As per HPI. OB History  Gravida Para Term Preterm AB Living  SAB TAB Ectopic Multiple Live Births      1        # Outcome Date GA Lbr Len/2nd Weight Sex Delivery Anes PTL Lv  3 Current           2 Term 12/09/14 [redacted]w[redacted]d  5 lb 14.4 oz (2.676 kg) F Vag-Spont     1 Ectopic               Any issues with any prior pregnancies: GDMA1 Any prior children are healthy, doing well, without any problems or issues: no History of pap smears: No. Last pap smear 2017. Abnormal: no  History of STIs: No   Past Medical History: Past Medical History:  Diagnosis Date  . Gestational diabetes   . Migraines   . Reflux     Past Surgical History: Past Surgical History:  Procedure Laterality Date  . DILATION AND CURETTAGE OF UTERUS    . LAPAROSCOPY      Family History:  Family History  Problem Relation Age of Onset  . Diabetes Father   . Diabetes Maternal Grandfather   . Diabetes Paternal Grandfather    She denies any female cancers, bleeding or blood clotting disorders.  She denies any history  of mental retardation, birth defects or genetic disorders in her or the FOB's history  Social History:  Social History   Social History  . Marital status: Single    Spouse name: N/A  . Number of children: N/A  . Years of education: N/A   Occupational History  . Not on file.   Social History Main Topics  . Smoking status: Former Smoker    Years: 15.00  . Smokeless tobacco: Never Used     Comment: trying to quit-smokes 1-3 cigarettes/day  . Alcohol use No  . Drug use: No  . Sexual activity: Yes    Birth control/ protection: None   Other Topics Concern  . Not on file   Social History Narrative  . No narrative on file   Any pets in the household: no  Allergy: Allergies  Allergen Reactions  . Latex Rash  . Sulfa Antibiotics Rash    Current Outpatient Medications:  Current Outpatient Prescriptions:  .  Prenatal Vit-Fe Fumarate-FA (MULTIVITAMIN-PRENATAL) 27-0.8 MG TABS tablet, Take 1 tablet by mouth daily at 12 noon., Disp: , Rfl:    Physical Exam:   BP 100/60   Wt 150 lb (68 kg)   LMP 06/19/2016   BMI 25.75 kg/m  Body mass index is 25.75 kg/m. Fundal height: not applicable FHTs: 150  Constitutional: Well nourished, well developed female in no acute distress.  Neck:  Supple, normal appearance, and no thyromegaly  Cardiovascular: S1, S2 normal, no murmur, rub or gallop, regular rate and rhythm Respiratory:  Clear to auscultation bilateral. Normal respiratory effort Abdomen: positive bowel sounds and no masses, hernias; diffusely non tender to palpation, non distended Breasts: unchanged from previous exams, risk and benefit of breast self-exam was discussed, patient declines to have breast exam. Neuro/Psych:  Normal mood and affect.  Skin:  Warm and dry.  Lymphatic:  No inguinal lymphadenopathy.   Pelvic exam: is not limited by body habitus EGBUS: within normal limits, Vagina: within normal limits and with no blood in the vault, Cervix: normal appearing  cervix without discharge or lesions, closed/long/high, Uterus:  enlarged: 14 weeks, and Adnexa:  no mass, fullness, tenderness  Assessment: [name] is a 31 y.o. G3P1011 [redacted]w[redacted]d based on Patient's last menstrual period was 06/19/2016. with an Estimated Date of Delivery: 03/26/17,  for prenatal care.  Plan:  No problem-specific Assessment & Plan notes found for this encounter.  1. [redacted] weeks gestation of pregnancy - US OB Comp Less 14 Wks; Future - Prenatal (OB Panel) - Urine culture  2. Screening for cervical cancer - IGP,CtNgTv,Apt HPV,rfx16/18,45  3. Screening for diabetes mellitus - Glucose, 1 hour; Future  4. Irregular menses - US OB Comp Less 14 Wks; Future  The patient has not traveled to a [place] Virus endemic area within the past 6 months, nor has she had unprotected sex with a partner who has travelled to a [place] endemic region within the past 6 months. The patient has been advised to notify us if these factors change any time during this current pregnancy, so adequate testing and monitoring can be initiated.  Problem list reviewed and updated. Early GLC tesring due to h/o GDM Plans to breast feed Declines genetic testing Follow up in 4 weeks.  Annamarie Major, MD, [facility], [facility] 10/01/2016  9:29 AM

## 2016-10-01 NOTE — Patient Instructions (Signed)
First Trimester of Pregnancy The first trimester of pregnancy is from week 1 until the end of week 13 (months 1 through 3). A week after a sperm fertilizes an egg, the egg will implant on the wall of the uterus. This embryo will begin to develop into a baby. Genes from you and your partner will form the baby. The female genes will determine whether the baby will be a boy or a girl. At 6-8 weeks, the eyes and face will be formed, and the heartbeat can be seen on ultrasound. At the end of 12 weeks, all the baby's organs will be formed. Now that you are pregnant, you will want to do everything you can to have a healthy baby. Two of the most important things are to get good prenatal care and to follow your health care provider's instructions. Prenatal care is all the medical care you receive before the baby's birth. This care will help prevent, find, and treat any problems during the pregnancy and childbirth. Body changes during your first trimester Your body goes through many changes during pregnancy. The changes vary from woman to woman.  You may gain or lose a couple of pounds at first.  You may feel sick to your stomach (nauseous) and you may throw up (vomit). If the vomiting is uncontrollable, call your health care provider.  You may tire easily.  You may develop headaches that can be relieved by medicines. All medicines should be approved by your health care provider.  You may urinate more often. Painful urination may mean you have a bladder infection.  You may develop heartburn as a result of your pregnancy.  You may develop constipation because certain hormones are causing the muscles that push stool through your intestines to slow down.  You may develop hemorrhoids or swollen veins (varicose veins).  Your breasts may begin to grow larger and become tender. Your nipples may stick out more, and the tissue that surrounds them (areola) may become darker.  Your gums may bleed and may be  sensitive to brushing and flossing.  Dark spots or blotches (chloasma, mask of pregnancy) may develop on your face. This will likely fade after the baby is born.  Your menstrual periods will stop.  You may have a loss of appetite.  You may develop cravings for certain kinds of food.  You may have changes in your emotions from day to day, such as being excited to be pregnant or being concerned that something may go wrong with the pregnancy and baby.  You may have more vivid and strange dreams.  You may have changes in your hair. These can include thickening of your hair, rapid growth, and changes in texture. Some women also have hair loss during or after pregnancy, or hair that feels dry or thin. Your hair will most likely return to normal after your baby is born. What to expect at prenatal visits During a routine prenatal visit:  You will be weighed to make sure you and the baby are growing normally.  Your blood pressure will be taken.  Your abdomen will be measured to track your baby's growth.  The fetal heartbeat will be listened to between weeks 10 and 14 of your pregnancy.  Test results from any previous visits will be discussed. Your health care provider may ask you:  How you are feeling.  If you are feeling the baby move.  If you have had any abnormal symptoms, such as leaking fluid, bleeding, severe headaches, or abdominal   cramping.  If you are using any tobacco products, including cigarettes, chewing tobacco, and electronic cigarettes.  If you have any questions. Other tests that may be performed during your first trimester include:  Blood tests to find your blood type and to check for the presence of any previous infections. The tests will also be used to check for low iron levels (anemia) and protein on red blood cells (Rh antibodies). Depending on your risk factors, or if you previously had diabetes during pregnancy, you may have tests to check for high blood sugar  that affects pregnant women (gestational diabetes).  Urine tests to check for infections, diabetes, or protein in the urine.  An ultrasound to confirm the proper growth and development of the baby.  Fetal screens for spinal cord problems (spina bifida) and Down syndrome.  HIV (human immunodeficiency virus) testing. Routine prenatal testing includes screening for HIV, unless you choose not to have this test.  You may need other tests to make sure you and the baby are doing well. Follow these instructions at home: Medicines   Follow your health care provider's instructions regarding medicine use. Specific medicines may be either safe or unsafe to take during pregnancy.  Take a prenatal vitamin that contains at least 600 micrograms (mcg) of folic acid.  If you develop constipation, try taking a stool softener if your health care provider approves. Eating and drinking   Eat a balanced diet that includes fresh fruits and vegetables, whole grains, good sources of protein such as meat, eggs, or tofu, and low-fat dairy. Your health care provider will help you determine the amount of weight gain that is right for you.  Avoid raw meat and uncooked cheese. These carry germs that can cause birth defects in the baby.  Eating four or five small meals rather than three large meals a day may help relieve nausea and vomiting. If you start to feel nauseous, eating a few soda crackers can be helpful. Drinking liquids between meals, instead of during meals, also seems to help ease nausea and vomiting.  Limit foods that are high in fat and processed sugars, such as fried and sweet foods.  To prevent constipation:  Eat foods that are high in fiber, such as fresh fruits and vegetables, whole grains, and beans.  Drink enough fluid to keep your urine clear or pale yellow. Activity   Exercise only as directed by your health care provider. Most women can continue their usual exercise routine during  pregnancy. Try to exercise for 30 minutes at least 5 days a week. Exercising will help you:  Control your weight.  Stay in shape.  Be prepared for labor and delivery.  Experiencing pain or cramping in the lower abdomen or lower back is a good sign that you should stop exercising. Check with your health care provider before continuing with normal exercises.  Try to avoid standing for long periods of time. Move your legs often if you must stand in one place for a long time.  Avoid heavy lifting.  Wear low-heeled shoes and practice good posture.  You may continue to have sex unless your health care provider tells you not to. Relieving pain and discomfort   Wear a good support bra to relieve breast tenderness.  Take warm sitz baths to soothe any pain or discomfort caused by hemorrhoids. Use hemorrhoid cream if your health care provider approves.  Rest with your legs elevated if you have leg cramps or low back pain.  If you develop   varicose veins in your legs, wear support hose. Elevate your feet for 15 minutes, 3-4 times a day. Limit salt in your diet. Prenatal care   Schedule your prenatal visits by the twelfth week of pregnancy. They are usually scheduled monthly at first, then more often in the last 2 months before delivery.  Write down your questions. Take them to your prenatal visits.  Keep all your prenatal visits as told by your health care provider. This is important. Safety   Wear your seat belt at all times when driving.  Make a list of emergency phone numbers, including numbers for family, friends, the hospital, and police and fire departments. General instructions   Ask your health care provider for a referral to a local prenatal education class. Begin classes no later than the beginning of month 6 of your pregnancy.  Ask for help if you have counseling or nutritional needs during pregnancy. Your health care provider can offer advice or refer you to specialists for  help with various needs.  Do not use hot tubs, steam rooms, or saunas.  Do not douche or use tampons or scented sanitary pads.  Do not cross your legs for long periods of time.  Avoid cat litter boxes and soil used by cats. These carry germs that can cause birth defects in the baby and possibly loss of the fetus by miscarriage or stillbirth.  Avoid all smoking, herbs, alcohol, and medicines not prescribed by your health care provider. Chemicals in these products affect the formation and growth of the baby.  Do not use any products that contain nicotine or tobacco, such as cigarettes and e-cigarettes. If you need help quitting, ask your health care provider. You may receive counseling support and other resources to help you quit.  Schedule a dentist appointment. At home, brush your teeth with a soft toothbrush and be gentle when you floss. Contact a health care provider if:  You have dizziness.  You have mild pelvic cramps, pelvic pressure, or nagging pain in the abdominal area.  You have persistent nausea, vomiting, or diarrhea.  You have a bad smelling vaginal discharge.  You have pain when you urinate.  You notice increased swelling in your face, hands, legs, or ankles.  You are exposed to fifth disease or chickenpox.  You are exposed to German measles (rubella) and have never had it. Get help right away if:  You have a fever.  You are leaking fluid from your vagina.  You have spotting or bleeding from your vagina.  You have severe abdominal cramping or pain.  You have rapid weight gain or loss.  You vomit blood or material that looks like coffee grounds.  You develop a severe headache.  You have shortness of breath.  You have any kind of trauma, such as from a fall or a car accident. Summary  The first trimester of pregnancy is from week 1 until the end of week 13 (months 1 through 3).  Your body goes through many changes during pregnancy. The changes vary  from woman to woman.  You will have routine prenatal visits. During those visits, your health care provider will examine you, discuss any test results you may have, and talk with you about how you are feeling. This information is not intended to replace advice given to you by your health care provider. Make sure you discuss any questions you have with your health care provider. Document Released: 05/22/2001 Document Revised: 05/09/2016 Document Reviewed: 05/09/2016 [employer] Interactive Patient Education    [employer]  Commonly Asked Questions During Pregnancy  Cats: A parasite can be excreted in cat feces.  To avoid exposure you need to have another person empty the little box.  If you must empty the litter box you will need to wear gloves.  Wash your hands after handling your cat.  This parasite can also be found in raw or undercooked meat so this should also be avoided.  Colds, Sore Throats, Flu: Please check your medication sheet to see what you can take for symptoms.  If your symptoms are unrelieved by these medications please call the office.  Dental Work: Most any dental work your dentist recommends is permitted.  X-rays should only be taken during the first trimester if absolutely necessary.  Your abdomen should be shielded with a lead apron during all x-rays.  Please notify your provider prior to receiving any x-rays.  Novocaine is fine; gas is not recommended.  If your dentist requires a note from us prior to dental work please call the office and we will provide one for you.  Exercise: Exercise is an important part of staying healthy during your pregnancy.  You may continue most exercises you were accustomed to prior to pregnancy.  Later in your pregnancy you will most likely notice you have difficulty with activities requiring balance like riding a bicycle.  It is important that you listen to your body and avoid activities that put you at a higher risk of falling.  Adequate rest and  staying well hydrated are a must!  If you have questions about the safety of specific activities ask your provider.    Exposure to Children with illness: Try to avoid obvious exposure; report any symptoms to us when noted,  If you have chicken pos, red measles or mumps, you should be immune to these diseases.   Please do not take any vaccines while pregnant unless you have checked with your OB provider.  Fetal Movement: After 28 weeks we recommend you do "kick counts" twice daily.  Lie or sit down in a calm quiet environment and count your baby movements "kicks".  You should feel your baby at least 10 times per hour.  If you have not felt 10 kicks within the first hour get up, walk around and have something sweet to eat or drink then repeat for an additional hour.  If count remains less than 10 per hour notify your provider.  Fumigating: Follow your pest control agent's advice as to how long to stay out of your home.  Ventilate the area well before re-entering.  Hemorrhoids:   Most over-the-counter preparations can be used during pregnancy.  Check your medication to see what is safe to use.  It is important to use a stool softener or fiber in your diet and to drink lots of liquids.  If hemorrhoids seem to be getting worse please call the office.   Hot Tubs:  Hot tubs Jacuzzis and saunas are not recommended while pregnant.  These increase your internal body temperature and should be avoided.  Intercourse:  Sexual intercourse is safe during pregnancy as long as you are comfortable, unless otherwise advised by your provider.  Spotting may occur after intercourse; report any bright red bleeding that is heavier than spotting.  Labor:  If you know that you are in labor, please go to the hospital.  If you are unsure, please call the office and let us help you decide what to do.  Lifting, straining, etc:  If your job   requires heavy lifting or straining please check with your provider for any limitations.   Generally, you should not lift items heavier than that you can lift simply with your hands and arms (no back muscles)  Painting:  Paint fumes do not harm your pregnancy, but may make you ill and should be avoided if possible.  Latex or water based paints have less odor than oils.  Use adequate ventilation while painting.  Permanents & Hair Color:  Chemicals in hair dyes are not recommended as they cause increase hair dryness which can increase hair loss during pregnancy.  " Highlighting" and permanents are allowed.  Dye may be absorbed differently and permanents may not hold as well during pregnancy.  Sunbathing:  Use a sunscreen, as skin burns easily during pregnancy.  Drink plenty of fluids; avoid over heating.  Tanning Beds:  Because their possible side effects are still unknown, tanning beds are not recommended.  Ultrasound Scans:  Routine ultrasounds are performed at approximately 20 weeks.  You will be able to see your baby's general anatomy an if you would like to know the gender this can usually be determined as well.  If it is questionable when you conceived you may also receive an ultrasound early in your pregnancy for dating purposes.  Otherwise ultrasound exams are not routinely performed unless there is a medical necessity.  Although you can request a scan we ask that you pay for it when conducted because insurance does not cover " patient request" scans.  Work: If your pregnancy proceeds without complications you may work until your due date, unless your physician or employer advises otherwise.  Round Ligament Pain/Pelvic Discomfort:  Sharp, shooting pains not associated with bleeding are fairly common, usually occurring in the second trimester of pregnancy.  They tend to be worse when standing up or when you remain standing for long periods of time.  These are the result of pressure of certain pelvic ligaments called "round ligaments".  Rest, Tylenol and heat seem to be the most  effective relief.  As the womb and fetus grow, they rise out of the pelvis and the discomfort improves.  Please notify the office if your pain seems different than that described.  It may represent a more serious condition.   

## 2016-10-02 NOTE — Addendum Note (Signed)
Addended by: Kathlene Cote on: 10/02/2016 02:42 PM   Modules accepted: Orders

## 2016-10-03 LAB — URINE CULTURE: Organism ID, Bacteria: NO GROWTH

## 2016-10-05 ENCOUNTER — Other Ambulatory Visit: Payer: Medicaid Other

## 2016-10-05 DIAGNOSIS — Z113 Encounter for screening for infections with a predominantly sexual mode of transmission: Secondary | ICD-10-CM

## 2016-10-05 DIAGNOSIS — Z131 Encounter for screening for diabetes mellitus: Secondary | ICD-10-CM

## 2016-10-05 DIAGNOSIS — Z3490 Encounter for supervision of normal pregnancy, unspecified, unspecified trimester: Secondary | ICD-10-CM

## 2016-10-05 LAB — IGP,CTNGTV,APT HPV,RFX16/18,45
Chlamydia, Nuc. Acid Amp: NEGATIVE
Gonococcus, Nuc. Acid Amp: NEGATIVE
HPV Aptima: POSITIVE — AB
HPV Genotype 16: NEGATIVE
HPV Genotype 18,45: NEGATIVE
PAP SMEAR COMMENT: 0
TRICH VAG BY NAA: NEGATIVE

## 2016-10-06 LAB — RPR+RH+ABO+RUB AB+AB SCR+CB...
Antibody Screen: NEGATIVE
HEMOGLOBIN: 11.8 g/dL (ref 11.1–15.9)
HIV SCREEN 4TH GENERATION: NONREACTIVE
Hematocrit: 34.4 % (ref 34.0–46.6)
Hepatitis B Surface Ag: NEGATIVE
MCH: 30.2 pg (ref 26.6–33.0)
MCHC: 34.3 g/dL (ref 31.5–35.7)
MCV: 88 fL (ref 79–97)
PLATELETS: 320 10*3/uL (ref 150–379)
RBC: 3.91 x10E6/uL (ref 3.77–5.28)
RDW: 14.4 % (ref 12.3–15.4)
RPR: NONREACTIVE
RUBELLA: 2.76 {index} (ref >0.99)
Rh Factor: POSITIVE
Varicella zoster IgG: 924 index (ref >165)
WBC: 10.1 10*3/uL (ref 3.4–10.8)

## 2016-10-06 LAB — GLUCOSE, 1 HOUR GESTATIONAL: Gestational Diabetes Screen: 118 mg/dL (ref 65–139)

## 2016-10-29 ENCOUNTER — Ambulatory Visit (INDEPENDENT_AMBULATORY_CARE_PROVIDER_SITE_OTHER): Payer: Medicaid Other | Admitting: Obstetrics and Gynecology

## 2016-10-29 VITALS — BP 112/50 | Wt 150.0 lb

## 2016-10-29 DIAGNOSIS — Z3490 Encounter for supervision of normal pregnancy, unspecified, unspecified trimester: Secondary | ICD-10-CM

## 2016-10-29 DIAGNOSIS — Z3A18 18 weeks gestation of pregnancy: Secondary | ICD-10-CM

## 2016-10-29 MED ORDER — OMEPRAZOLE 40 MG PO CPDR: 40.0000 mg | DELAYED_RELEASE_CAPSULE | Freq: Every day | ORAL | 8 refills | Status: DC

## 2016-10-29 NOTE — Progress Notes (Signed)
Headaches/heartburn

## 2016-10-29 NOTE — Progress Notes (Signed)
Cruise to [place] in next week, discussed use of DEET, ideally advised to delay.  Consider serologies if has symptoms of Zika on return

## 2016-10-29 NOTE — Addendum Note (Signed)
Addended by: Lorrene ReidSTAEBLER, Delvis Kau M on: 10/29/2016 11:34 AM   Modules accepted: Orders

## 2016-11-02 ENCOUNTER — Telehealth: Payer: Self-pay

## 2016-11-02 ENCOUNTER — Other Ambulatory Visit: Payer: Self-pay

## 2016-11-02 NOTE — Telephone Encounter (Signed)
Pt calling, rx for omeprazole is not at the pharm.  [phone]

## 2016-11-02 NOTE — Telephone Encounter (Signed)
Pt is calling back to speak to Selena BattenKim due to missed call. Please call patient.

## 2016-11-02 NOTE — Telephone Encounter (Signed)
Message left for patient to call me back.

## 2016-11-02 NOTE — Telephone Encounter (Signed)
Pt aware Omeprazole was called in

## 2016-11-19 ENCOUNTER — Ambulatory Visit (INDEPENDENT_AMBULATORY_CARE_PROVIDER_SITE_OTHER): Payer: Medicaid Other

## 2016-11-19 ENCOUNTER — Ambulatory Visit (INDEPENDENT_AMBULATORY_CARE_PROVIDER_SITE_OTHER): Payer: Medicaid Other | Admitting: Obstetrics & Gynecology

## 2016-11-19 VITALS — BP 112/54 | Wt 156.0 lb

## 2016-11-19 DIAGNOSIS — Z3A18 18 weeks gestation of pregnancy: Secondary | ICD-10-CM

## 2016-11-19 DIAGNOSIS — Z362 Encounter for other antenatal screening follow-up: Secondary | ICD-10-CM | POA: Diagnosis not present

## 2016-11-19 DIAGNOSIS — Z3490 Encounter for supervision of normal pregnancy, unspecified, unspecified trimester: Secondary | ICD-10-CM

## 2016-11-19 DIAGNOSIS — Z3A21 21 weeks gestation of pregnancy: Secondary | ICD-10-CM

## 2016-11-19 NOTE — Patient Instructions (Signed)

## 2016-11-19 NOTE — Progress Notes (Signed)
Anatomy scan today/braxton hicks ctx/its a girl

## 2016-12-17 ENCOUNTER — Ambulatory Visit (INDEPENDENT_AMBULATORY_CARE_PROVIDER_SITE_OTHER): Payer: Medicaid Other | Admitting: Obstetrics & Gynecology

## 2016-12-17 VITALS — BP 110/60 | Wt 156.0 lb

## 2016-12-17 DIAGNOSIS — Z3490 Encounter for supervision of normal pregnancy, unspecified, unspecified trimester: Secondary | ICD-10-CM

## 2016-12-17 DIAGNOSIS — Z3A25 25 weeks gestation of pregnancy: Secondary | ICD-10-CM

## 2016-12-17 NOTE — Progress Notes (Signed)
PNV, Glc nv, Breast feeding plans, uncertain BC Pt had lac w repair at last delivery, it has been chronically uncomfortable since then, desires repair at time of this delivery as appropriate.    Counseled this may or may not be feasible or advisable based on exam and nature of any lac this delivery    Options for later vaginal repair/reconstruction based on chronicity of sx's discussed.

## 2016-12-17 NOTE — Patient Instructions (Signed)

## 2016-12-26 ENCOUNTER — Ambulatory Visit (INDEPENDENT_AMBULATORY_CARE_PROVIDER_SITE_OTHER): Payer: Medicaid Other | Admitting: Obstetrics and Gynecology

## 2016-12-26 VITALS — BP 104/86 | Wt 156.0 lb

## 2016-12-26 DIAGNOSIS — O26892 Other specified pregnancy related conditions, second trimester: Secondary | ICD-10-CM

## 2016-12-26 DIAGNOSIS — N898 Other specified noninflammatory disorders of vagina: Secondary | ICD-10-CM

## 2016-12-26 DIAGNOSIS — Z3A27 27 weeks gestation of pregnancy: Secondary | ICD-10-CM

## 2016-12-26 NOTE — Progress Notes (Signed)
Sharp pains in cervix area w/odor - cervix closed physiologic discharge, negative nitrazine No wetness Braxton hicks

## 2017-01-14 ENCOUNTER — Other Ambulatory Visit: Payer: Medicaid Other

## 2017-01-14 ENCOUNTER — Ambulatory Visit (INDEPENDENT_AMBULATORY_CARE_PROVIDER_SITE_OTHER): Payer: Medicaid Other | Admitting: Obstetrics and Gynecology

## 2017-01-14 VITALS — BP 114/70 | Wt 158.0 lb

## 2017-01-14 DIAGNOSIS — Z3490 Encounter for supervision of normal pregnancy, unspecified, unspecified trimester: Secondary | ICD-10-CM

## 2017-01-14 DIAGNOSIS — Z3A29 29 weeks gestation of pregnancy: Secondary | ICD-10-CM

## 2017-01-14 DIAGNOSIS — Z3A25 25 weeks gestation of pregnancy: Secondary | ICD-10-CM

## 2017-01-14 NOTE — Progress Notes (Signed)
Prenatal Visit Note Date: 01/14/2017 Clinic: [facility]  Subjective:  [name] [name] is a 31 y.o. G3P1011 at 746w6d being seen today for ongoing prenatal care.  She is currently monitored for the following issues for this low-risk pregnancy and has Encounter for supervision of low-risk pregnancy, antepartum on her problem list.   ----------------------------------------------------------------------------------- Patient reports no bleeding, no contractions and no leaking.   Contractions: Not present. Vag. Bleeding: None.  Movement: Present. Denies leaking of fluid.  ----------------------------------------------------------------------------------- The following portions of the patient's history were reviewed and updated as appropriate: allergies, current medications, past family history, past medical history, past social history, past surgical history and problem list. Problem list updated.  Objective:   Vitals:   01/14/17 0856  BP: 114/70  Weight: 158 lb (71.7 kg)   Total weight gain for pregnancy: 3 lb (1.361 kg)   Fetal Status: Fetal Heart Rate (bpm): 135 Fundal Height: 29 cm Movement: Present     General:  Alert, oriented and cooperative. Patient is in no acute distress.  Skin: Skin is warm and dry. No rash noted.   Cardiovascular: Normal heart rate noted  Respiratory: Normal respiratory effort, no problems with respiration noted  Abdomen: Soft, gravid, appropriate for gestational age. Pain/Pressure: Absent     Pelvic:  Cervical exam deferred        Extremities: Normal range of motion.  Edema: None  Mental Status: Normal mood and affect. Normal behavior. Normal judgment and thought content.   Urinalysis: Urine Protein: Negative Urine Glucose: Negative  Assessment and Plan:  Pregnancy: G3P1011 at 316w6d  1. Encounter for supervision of low-risk pregnancy, antepartum 2. [redacted] weeks gestation of pregnancy 1h gtt and "28 week" labs today.   Preterms labor symptoms and  general obstetric precautions including but not limited to vaginal bleeding, contractions, leaking of fluid and fetal movement were reviewed in detail with the patient. Please refer to After Visit Summary for other counseling recommendations.   Return in about 2 weeks (around 01/28/2017) for Routine Prenatal Appointment.  Thomasene MohairStephen Jackson, MD 01/14/2017 9:24 AM

## 2017-01-16 ENCOUNTER — Other Ambulatory Visit: Payer: Self-pay | Admitting: Obstetrics & Gynecology

## 2017-01-16 DIAGNOSIS — O99810 Abnormal glucose complicating pregnancy: Secondary | ICD-10-CM

## 2017-01-16 LAB — 28 WEEK RH+PANEL
BASOS: 0 %
Basophils Absolute: 0 10*3/uL (ref 0.0–0.2)
EOS (ABSOLUTE): 0.4 10*3/uL (ref 0.0–0.4)
EOS: 4 %
Gestational Diabetes Screen: 190 mg/dL — ABNORMAL HIGH (ref 65–139)
HIV Screen 4th Generation wRfx: NONREACTIVE
Hematocrit: 29.9 % — ABNORMAL LOW (ref 34.0–46.6)
Hemoglobin: 10.1 g/dL — ABNORMAL LOW (ref 11.1–15.9)
IMMATURE GRANULOCYTES: 0 %
Immature Grans (Abs): 0 10*3/uL (ref 0.0–0.1)
LYMPHS ABS: 2.4 10*3/uL (ref 0.7–3.1)
Lymphs: 20 %
MCH: 29 pg (ref 26.6–33.0)
MCHC: 33.8 g/dL (ref 31.5–35.7)
MCV: 86 fL (ref 79–97)
MONOS ABS: 0.9 10*3/uL (ref 0.1–0.9)
Monocytes: 8 %
NEUTROS PCT: 68 %
Neutrophils Absolute: 7.9 10*3/uL — ABNORMAL HIGH (ref 1.4–7.0)
Platelets: 329 10*3/uL (ref 150–379)
RBC: 3.48 x10E6/uL — AB (ref 3.77–5.28)
RDW: 13.7 % (ref 12.3–15.4)
RPR: NONREACTIVE
WBC: 11.6 10*3/uL — AB (ref 3.4–10.8)

## 2017-01-16 NOTE — Progress Notes (Signed)
Arrange for 3 hour GTT testing and let her know screening test for diabetes was elevated

## 2017-01-21 ENCOUNTER — Other Ambulatory Visit: Payer: Medicaid Other

## 2017-01-21 DIAGNOSIS — O99810 Abnormal glucose complicating pregnancy: Secondary | ICD-10-CM

## 2017-01-22 LAB — GESTATIONAL GLUCOSE TOLERANCE
Glucose, Fasting: 85 mg/dL (ref 65–94)
Glucose, GTT - 1 Hour: 183 mg/dL — ABNORMAL HIGH (ref 65–179)
Glucose, GTT - 2 Hour: 154 mg/dL (ref 65–154)
Glucose, GTT - 3 Hour: 115 mg/dL (ref 65–139)

## 2017-01-28 ENCOUNTER — Ambulatory Visit (INDEPENDENT_AMBULATORY_CARE_PROVIDER_SITE_OTHER): Payer: Medicaid Other | Admitting: Obstetrics & Gynecology

## 2017-01-28 VITALS — BP 120/70 | Wt 160.0 lb

## 2017-01-28 DIAGNOSIS — Z3A31 31 weeks gestation of pregnancy: Secondary | ICD-10-CM

## 2017-01-28 DIAGNOSIS — Z3490 Encounter for supervision of normal pregnancy, unspecified, unspecified trimester: Secondary | ICD-10-CM

## 2017-01-28 NOTE — Patient Instructions (Signed)
Third Trimester of Pregnancy The third trimester is from week 28 through week 40 (months 7 through 9). The third trimester is a time when the unborn baby (fetus) is growing rapidly. At the end of the ninth month, the fetus is about 20 inches in length and weighs 6-10 pounds. Body changes during your third trimester Your body will continue to go through many changes during pregnancy. The changes vary from woman to woman. During the third trimester:  Your weight will continue to increase. You can expect to gain 25-35 pounds (11-16 kg) by the end of the pregnancy.  You may begin to get stretch marks on your hips, abdomen, and breasts.  You may urinate more often because the fetus is moving lower into your pelvis and pressing on your bladder.  You may develop or continue to have heartburn. This is caused by increased hormones that slow down muscles in the digestive tract.  You may develop or continue to have constipation because increased hormones slow digestion and cause the muscles that push waste through your intestines to relax.  You may develop hemorrhoids. These are swollen veins (varicose veins) in the rectum that can itch or be painful.  You may develop swollen, bulging veins (varicose veins) in your legs.  You may have increased body aches in the pelvis, back, or thighs. This is due to weight gain and increased hormones that are relaxing your joints.  You may have changes in your hair. These can include thickening of your hair, rapid growth, and changes in texture. Some women also have hair loss during or after pregnancy, or hair that feels dry or thin. Your hair will most likely return to normal after your baby is born.  Your breasts will continue to grow and they will continue to become tender. A yellow fluid (colostrum) may leak from your breasts. This is the first milk you are producing for your baby.  Your belly button may stick out.  You may notice more swelling in your hands,  face, or ankles.  You may have increased tingling or numbness in your hands, arms, and legs. The skin on your belly may also feel numb.  You may feel short of breath because of your expanding uterus.  You may have more problems sleeping. This can be caused by the size of your belly, increased need to urinate, and an increase in your body's metabolism.  You may notice the fetus "dropping," or moving lower in your abdomen (lightening).  You may have increased vaginal discharge.  You may notice your joints feel loose and you may have pain around your pelvic bone.  What to expect at prenatal visits You will have prenatal exams every 2 weeks until week 36. Then you will have weekly prenatal exams. During a routine prenatal visit:  You will be weighed to make sure you and the baby are growing normally.  Your blood pressure will be taken.  Your abdomen will be measured to track your baby's growth.  The fetal heartbeat will be listened to.  Any test results from the previous visit will be discussed.  You may have a cervical check near your due date to see if your cervix has softened or thinned (effaced).  You will be tested for Group B streptococcus. This happens between 35 and 37 weeks.  Your health care provider may ask you:  What your birth plan is.  How you are feeling.  If you are feeling the baby move.  If you have had   any abnormal symptoms, such as leaking fluid, bleeding, severe headaches, or abdominal cramping.  If you are using any tobacco products, including cigarettes, chewing tobacco, and electronic cigarettes.  If you have any questions.  Other tests or screenings that may be performed during your third trimester include:  Blood tests that check for low iron levels (anemia).  Fetal testing to check the health, activity level, and growth of the fetus. Testing is done if you have certain medical conditions or if there are problems during the  pregnancy.  Nonstress test (NST). This test checks the health of your baby to make sure there are no signs of problems, such as the baby not getting enough oxygen. During this test, a belt is placed around your belly. The baby is made to move, and its heart rate is monitored during movement.  What is false labor? False labor is a condition in which you feel small, irregular tightenings of the muscles in the womb (contractions) that usually go away with rest, changing position, or drinking water. These are called Braxton Hicks contractions. Contractions may last for hours, days, or even weeks before true labor sets in. If contractions come at regular intervals, become more frequent, increase in intensity, or become painful, you should see your health care provider. What are the signs of labor?  Abdominal cramps.  Regular contractions that start at 10 minutes apart and become stronger and more frequent with time.  Contractions that start on the top of the uterus and spread down to the lower abdomen and back.  Increased pelvic pressure and dull back pain.  A watery or bloody mucus discharge that comes from the vagina.  Leaking of amniotic fluid. This is also known as your "water breaking." It could be a slow trickle or a gush. Let your health care provider know if it has a color or strange odor. If you have any of these signs, call your health care provider right away, even if it is before your due date. Follow these instructions at home: Medicines  Follow your health care provider's instructions regarding medicine use. Specific medicines may be either safe or unsafe to take during pregnancy.  Take a prenatal vitamin that contains at least 600 micrograms (mcg) of folic acid.  If you develop constipation, try taking a stool softener if your health care provider approves. Eating and drinking  Eat a balanced diet that includes fresh fruits and vegetables, whole grains, good sources of protein  such as meat, eggs, or tofu, and low-fat dairy. Your health care provider will help you determine the amount of weight gain that is right for you.  Avoid raw meat and uncooked cheese. These carry germs that can cause birth defects in the baby.  If you have low calcium intake from food, talk to your health care provider about whether you should take a daily calcium supplement.  Eat four or five small meals rather than three large meals a day.  Limit foods that are high in fat and processed sugars, such as fried and sweet foods.  To prevent constipation: ? Drink enough fluid to keep your urine clear or pale yellow. ? Eat foods that are high in fiber, such as fresh fruits and vegetables, whole grains, and beans. Activity  Exercise only as directed by your health care provider. Most women can continue their usual exercise routine during pregnancy. Try to exercise for 30 minutes at least 5 days a week. Stop exercising if you experience uterine contractions.  Avoid heavy   lifting.  Do not exercise in extreme heat or humidity, or at high altitudes.  Wear low-heel, comfortable shoes.  Practice good posture.  You may continue to have sex unless your health care provider tells you otherwise. Relieving pain and discomfort  Take frequent breaks and rest with your legs elevated if you have leg cramps or low back pain.  Take warm sitz baths to soothe any pain or discomfort caused by hemorrhoids. Use hemorrhoid cream if your health care provider approves.  Wear a good support bra to prevent discomfort from breast tenderness.  If you develop varicose veins: ? Wear support pantyhose or compression stockings as told by your healthcare provider. ? Elevate your feet for 15 minutes, 3-4 times a day. Prenatal care  Write down your questions. Take them to your prenatal visits.  Keep all your prenatal visits as told by your health care provider. This is important. Safety  Wear your seat belt at  all times when driving.  Make a list of emergency phone numbers, including numbers for family, friends, the hospital, and police and fire departments. General instructions  Avoid cat litter boxes and soil used by cats. These carry germs that can cause birth defects in the baby. If you have a cat, ask someone to clean the litter box for you.  Do not travel far distances unless it is absolutely necessary and only with the approval of your health care provider.  Do not use hot tubs, steam rooms, or saunas.  Do not drink alcohol.  Do not use any products that contain nicotine or tobacco, such as cigarettes and e-cigarettes. If you need help quitting, ask your health care provider.  Do not use any medicinal herbs or unprescribed drugs. These chemicals affect the formation and growth of the baby.  Do not douche or use tampons or scented sanitary pads.  Do not cross your legs for long periods of time.  To prepare for the arrival of your baby: ? Take prenatal classes to understand, practice, and ask questions about labor and delivery. ? Make a trial run to the hospital. ? Visit the hospital and tour the maternity area. ? Arrange for maternity or paternity leave through employers. ? Arrange for family and friends to take care of pets while you are in the hospital. ? Purchase a rear-facing car seat and make sure you know how to install it in your car. ? Pack your hospital bag. ? Prepare the baby's nursery. Make sure to remove all pillows and stuffed animals from the baby's crib to prevent suffocation.  Visit your dentist if you have not gone during your pregnancy. Use a soft toothbrush to brush your teeth and be gentle when you floss. Contact a health care provider if:  You are unsure if you are in labor or if your water has broken.  You become dizzy.  You have mild pelvic cramps, pelvic pressure, or nagging pain in your abdominal area.  You have lower back pain.  You have persistent  nausea, vomiting, or diarrhea.  You have an unusual or bad smelling vaginal discharge.  You have pain when you urinate. Get help right away if:  Your water breaks before 37 weeks.  You have regular contractions less than 5 minutes apart before 37 weeks.  You have a fever.  You are leaking fluid from your vagina.  You have spotting or bleeding from your vagina.  You have severe abdominal pain or cramping.  You have rapid weight loss or weight gain.    You have shortness of breath with chest pain.  You notice sudden or extreme swelling of your face, hands, ankles, feet, or legs.  Your baby makes fewer than 10 movements in 2 hours.  You have severe headaches that do not go away when you take medicine.  You have vision changes. Summary  The third trimester is from week 28 through week 40, months 7 through 9. The third trimester is a time when the unborn baby (fetus) is growing rapidly.  During the third trimester, your discomfort may increase as you and your baby continue to gain weight. You may have abdominal, leg, and back pain, sleeping problems, and an increased need to urinate.  During the third trimester your breasts will keep growing and they will continue to become tender. A yellow fluid (colostrum) may leak from your breasts. This is the first milk you are producing for your baby.  False labor is a condition in which you feel small, irregular tightenings of the muscles in the womb (contractions) that eventually go away. These are called Braxton Hicks contractions. Contractions may last for hours, days, or even weeks before true labor sets in.  Signs of labor can include: abdominal cramps; regular contractions that start at 10 minutes apart and become stronger and more frequent with time; watery or bloody mucus discharge that comes from the vagina; increased pelvic pressure and dull back pain; and leaking of amniotic fluid. This information is not intended to replace advice  given to you by your health care provider. Make sure you discuss any questions you have with your health care provider. Document Released: 05/22/2001 Document Revised: 11/03/2015 Document Reviewed: 07/29/2012 Elsevier Interactive Patient Education  2017 [employer]  

## 2017-01-28 NOTE — Progress Notes (Signed)
PNV, FMC, PTL precautions, labs discussed. Last TDaP was July 2016. Desires, but wait til next visit for shot.

## 2017-02-12 ENCOUNTER — Ambulatory Visit (INDEPENDENT_AMBULATORY_CARE_PROVIDER_SITE_OTHER): Payer: Medicaid Other | Admitting: Obstetrics & Gynecology

## 2017-02-12 VITALS — BP 100/60 | Wt 160.0 lb

## 2017-02-12 DIAGNOSIS — Z23 Encounter for immunization: Secondary | ICD-10-CM | POA: Diagnosis not present

## 2017-02-12 DIAGNOSIS — Z3493 Encounter for supervision of normal pregnancy, unspecified, third trimester: Secondary | ICD-10-CM

## 2017-02-12 DIAGNOSIS — Z3A34 34 weeks gestation of pregnancy: Secondary | ICD-10-CM

## 2017-02-12 DIAGNOSIS — Z3490 Encounter for supervision of normal pregnancy, unspecified, unspecified trimester: Secondary | ICD-10-CM

## 2017-02-12 MED ORDER — TETANUS-DIPHTH-ACELL PERTUSSIS 5-2.5-18.5 LF-MCG/0.5 IM SUSP
0.5000 mL | Freq: Once | INTRAMUSCULAR | Status: AC
  Administered 2017-02-12: 0.5 mL via INTRAMUSCULAR

## 2017-02-12 NOTE — Progress Notes (Signed)
PNV, FMC, Labor precautions, TDaP today

## 2017-02-12 NOTE — Addendum Note (Signed)
Addended by: Cornelius MorasPATTERSON, Lorrin Bodner D on: 02/12/2017 11:49 AM   Modules accepted: Orders

## 2017-02-12 NOTE — Patient Instructions (Signed)

## 2017-02-14 ENCOUNTER — Telehealth: Payer: Self-pay

## 2017-02-14 NOTE — Telephone Encounter (Signed)
Left message for pt to call me back 

## 2017-02-14 NOTE — Telephone Encounter (Signed)
[facility]. She was given the TDAP on Tuesday and she now has a lump on her neck on the same side the injection was given. CB# [phone]

## 2017-02-14 NOTE — Telephone Encounter (Signed)
Pt states she has a lump that's the size of a pencil eraser and it hurts, it popped up when her arm started hurting from the TDAP. I advised pt I didn't think it had any thing to do with the tdap but would ask the DR for advice..Please Advise

## 2017-02-14 NOTE — Telephone Encounter (Signed)
Can be looked at it by someone at office today to reassure not an infection.

## 2017-02-15 ENCOUNTER — Ambulatory Visit (INDEPENDENT_AMBULATORY_CARE_PROVIDER_SITE_OTHER): Payer: Medicaid Other | Admitting: Certified Nurse Midwife

## 2017-02-15 VITALS — BP 100/60 | Wt 159.0 lb

## 2017-02-15 DIAGNOSIS — R590 Localized enlarged lymph nodes: Secondary | ICD-10-CM

## 2017-02-15 DIAGNOSIS — Z3A34 34 weeks gestation of pregnancy: Secondary | ICD-10-CM

## 2017-02-15 NOTE — Telephone Encounter (Signed)
[facility]

## 2017-02-15 NOTE — Progress Notes (Signed)
Presents at 34 wk 3days with an enlarged tender lymph node at the base of her neck on the right. Noticed this the day after receiving a TDAP injection on her right deltoid She denies any fever, sore throat, ear ache or any draining lesions of the skin. There is no inflammation at the site of her injection. Her upper arm and right side of her neck feels a little stiff, but otherwise feels fine. Has had small spider angiomas or petechiae on upper arms and chest, a few on her face which first appeared at the end of her last pregnancy and increased at the beginning of this pregnancy. She reports being bite by a tick on her left upper thigh a few weeks ago, but never developed a rash after that On exam: there is a 1 cm tender mobile, soft lymph node in the posterior right cervical chain palpated at the trapezius muscle at the base of her neck A: Tender lymph node in posterior cervical chain-probably due to inflammation following TDAP injection P: Follow up in 2 weeks to check for resolution, growth Consider surgery referral if the lymph node enlarges.

## 2017-02-15 NOTE — Telephone Encounter (Signed)
Pt is schedule with [facility] 02/15/17

## 2017-02-15 NOTE — Progress Notes (Signed)
Pt c/o lump in next after tdap vaccine on Tuesday.

## 2017-02-26 ENCOUNTER — Telehealth: Payer: Self-pay

## 2017-02-26 ENCOUNTER — Observation Stay
Admission: EM | Admit: 2017-02-26 | Discharge: 2017-02-26 | Disposition: A | Discharge status: Home / Self Care | Payer: Medicaid Other | Attending: Certified Nurse Midwife | Admitting: Certified Nurse Midwife

## 2017-02-26 DIAGNOSIS — Z3A36 36 weeks gestation of pregnancy: Secondary | ICD-10-CM

## 2017-02-26 DIAGNOSIS — O4703 False labor before 37 completed weeks of gestation, third trimester: Principal | ICD-10-CM | POA: Insufficient documentation

## 2017-02-26 DIAGNOSIS — O479 False labor, unspecified: Secondary | ICD-10-CM | POA: Diagnosis present

## 2017-02-26 DIAGNOSIS — O4700 False labor before 37 completed weeks of gestation, unspecified trimester: Secondary | ICD-10-CM | POA: Diagnosis present

## 2017-02-26 NOTE — OB Triage Note (Signed)
Patient presented to L&D with complaints of contractions all through the night anywhere from 8-11 minutes apart.  Patient was able to sleep through them, states they aren't very painful. Denies vaginal bleeding, leaking of fluid or decreased fetal movement.  States she has been having some nausea off and on this morning, no vomiting, no diarrhea.

## 2017-02-26 NOTE — Final Progress Note (Signed)
Physician Final Progress Note  Patient ID: [name] [name] MRN: [id number] DOB/AGE: Aug 23, 1985 31 y.o.  Admit date: 02/26/2017 Admitting provider: Vena Austria, MD Discharge date: 02/26/2017   Admission Diagnoses: IUP at 36 weeks with preterm contractions  Discharge Diagnoses:  Same as above.  Consults: None  Significant Findings/ Diagnostic Studies: 31 year old G3 P1011 with EDC=03/26/2017 by LMP=14wk6d ultrasound presents today at [redacted] weeks gestation with complaints of contractions that started last night and were q6-7 min apart. They were not painful and she was able to sleep through them. They resumed this AM. She has a history of a sudden onset of labor, progressing rapidly with G1 and is concerned that her cervix is dilating and she is in labor. Has been having reflux with her pregnancy and some nausea this AM, but did not vomit. No diarrhea. Did have some pasta to eat this AM. Has been drinking water. Baby active. No vaginal bleeding or leakage of fluid.    Prenatal care remarkable for a history of GDM with G1, late entry to care at 14 weeks, an elevated 1 hour GTT at 28 weeks of 190 with a 3 hr GTT with one abnormal value, GERD, and mild anemia. [facility] Prenatal Labs  Dating LMP= [place] at 14wk6days Blood type:   O positive  Genetic Screen Declines Antibody: negative  Anatomic [place]  Rubella: Immune   Varicella: I  GTT Early:   118            Third trimester: 190 3hour: 85/183/154/115 RPR:   negative  Rhogam  HBsAg:   negative  TDaP vaccine      02/12/2017                 Flu Shot: HIV:   negative  Baby Food      BREAST                          GBS:   Contraception CONDOMS Pap:  CBB     CS/VBAC    Support Person        Medications: Omeprazole 40 mgm daily Prenatal vitamins Allergies  Allergen Reactions  . Latex Rash  . Sulfa Antibiotics Rash   OB History  Gravida Para Term Preterm AB Living  SAB TAB Ectopic Multiple Live Births      1        #  Outcome Date GA Lbr Len/2nd Weight Sex Delivery Anes PTL Lv  3 Current           2 Term 12/09/14 [redacted]w[redacted]d  2.676 kg (5 lb 14.4 oz) F Vag-Spont     1 Ectopic              Exam: General: smiling, gravid WF in NAD Vs:BP 123/65 (BP Location: Left Arm)   Pulse 91   Temp 98.1 F (36.7 C) (Oral)   Resp 19   Ht  (1.626 m)   Wt 74.8 kg (165 lb)   LMP 06/19/2016   BMI 28.32 kg/m   Abdomen: cephalic presentation, soft and nontender uterus FHR: baseline 150 with accelerations to 180s, moderate variability, no decelerations Toco: two mild contractions in 30 minutes, 12-13 min apart Cervix: 1/30%/-2 to -3  A: Preterm or BH contractions at 36 weekss  P: Discharge home with labor precautions. Increase fluids Follow up in 2 days for ROB visit at [facility].  Procedures: none  Discharge Condition: stable  Disposition: 01-Home or Self Care  Diet: Regular diet  Discharge Activity: Activity as tolerated     Total time spent taking care of this patient: 15 minutes  Signed: Farrel Conners 02/26/2017, 12:03 PM

## 2017-02-26 NOTE — Telephone Encounter (Signed)
Pt is 36wks c/o ctxs 8-10/hr since last night and is feeling sick this am.  They are strong but not painful, 2nd child.  Adv to gl to L&D via ED.  Carmen and CLG notified.

## 2017-02-28 ENCOUNTER — Ambulatory Visit (INDEPENDENT_AMBULATORY_CARE_PROVIDER_SITE_OTHER): Payer: Medicaid Other | Admitting: Obstetrics & Gynecology

## 2017-02-28 VITALS — BP 100/60 | Wt 161.0 lb

## 2017-02-28 DIAGNOSIS — Z3A36 36 weeks gestation of pregnancy: Secondary | ICD-10-CM

## 2017-02-28 DIAGNOSIS — Z3490 Encounter for supervision of normal pregnancy, unspecified, unspecified trimester: Secondary | ICD-10-CM

## 2017-02-28 NOTE — Progress Notes (Signed)
GBS done. PNV, FMC.  Breast feeding plans.  Plans on abstinance and then condoms for [facility].

## 2017-02-28 NOTE — Patient Instructions (Signed)

## 2017-03-04 LAB — CULTURE, BETA STREP (GROUP B ONLY): STREP GP B CULTURE: NEGATIVE

## 2017-03-07 ENCOUNTER — Ambulatory Visit (INDEPENDENT_AMBULATORY_CARE_PROVIDER_SITE_OTHER): Payer: Medicaid Other | Admitting: Obstetrics and Gynecology

## 2017-03-07 VITALS — BP 110/68 | Wt 161.0 lb

## 2017-03-07 DIAGNOSIS — Z3490 Encounter for supervision of normal pregnancy, unspecified, unspecified trimester: Secondary | ICD-10-CM

## 2017-03-07 DIAGNOSIS — Z3A37 37 weeks gestation of pregnancy: Secondary | ICD-10-CM

## 2017-03-07 NOTE — Progress Notes (Signed)
  Routine Prenatal Care Visit  Subjective  [name] [name] is a 31 y.o. G3P1011 at [redacted]w[redacted]d being seen today for ongoing prenatal care.  She is currently monitored for the following issues for this low-risk pregnancy and has Encounter for supervision of low-risk pregnancy, antepartum and Preterm contractions on her problem list.  ----------------------------------------------------------------------------------- Patient reports no complaints.   Contractions: Irregular. Vag. Bleeding: None.  Movement: Present. Denies leaking of fluid.  ----------------------------------------------------------------------------------- The following portions of the patient's history were reviewed and updated as appropriate: allergies, current medications, past family history, past medical history, past social history, past surgical history and problem list. Problem list updated.  Objective  Blood pressure 110/68, weight 161 lb (73 kg), last menstrual period 06/19/2016, unknown if currently breastfeeding. Pregravid weight 150 lb (68 kg) Total Weight Gain 11 lb (4.99 kg) Urinalysis: Urine Protein: Negative Urine Glucose: Negative  Fetal Status: Fetal Heart Rate (bpm): 150 Fundal Height: 37 cm Movement: Present  Presentation: Vertex  General:  Alert, oriented and cooperative. Patient is in no acute distress.  Skin: Skin is warm and dry. No rash noted.   Cardiovascular: Normal heart rate noted  Respiratory: Normal respiratory effort, no problems with respiration noted  Abdomen: Soft, gravid, appropriate for gestational age. Pain/Pressure: Absent     Pelvic:  Cervical exam performed Dilation: 2 Effacement (%): 50 Station: -3  Extremities: Normal range of motion.     Mental Status: Normal mood and affect. Normal behavior. Normal judgment and thought content.   Assessment   31 y.o. G3P1011 at [redacted]w[redacted]d by  03/26/2017, by Last Menstrual Period presenting for routine prenatal visit  Plan   pregnancy 3 Problems (from  06/19/16 to present)    Problem Noted Resolved   Encounter for supervision of low-risk pregnancy, antepartum 10/01/2016 by Nadara Mustard, MD No   Overview Addendum 03/07/2017 10:07 AM by Conard Novak, MD    [facility] Prenatal Labs  Dating LMP= [place] at 14wk6days Blood type: O/Positive/-- (04/27 0943) O positive  Genetic Screen Declines Antibody:Negative (04/27 0943)negative  Anatomic [place]  Rubella: Immune 2.76 (04/27 1610) Varicella: I  GTT Early:   118            Third trimester: 190 3hour: 85/183/154/115 RPR: Non Reactive (08/06 0838) negative  Rhogam  HBsAg: Negative (04/27 0943) negative  TDaP vaccine      02/12/2017                 Flu Shot: HIV:   negative  Baby Food      BREAST                          GBS: Neg 9/20  Contraception CONDOMS Pap:  CBB                 Term labor symptoms and general obstetric precautions including but not limited to vaginal bleeding, contractions, leaking of fluid and fetal movement were reviewed in detail with the patient. Please refer to After Visit Summary for other counseling recommendations.   Return in about 1 week (around 03/14/2017) for Routine Prenatal Appointment.  Thomasene Mohair, MD  03/07/2017 10:18 AM

## 2017-03-14 ENCOUNTER — Ambulatory Visit (INDEPENDENT_AMBULATORY_CARE_PROVIDER_SITE_OTHER): Payer: Medicaid Other | Admitting: Certified Nurse Midwife

## 2017-03-14 VITALS — BP 110/70 | Wt 160.0 lb

## 2017-03-14 DIAGNOSIS — Z3A38 38 weeks gestation of pregnancy: Secondary | ICD-10-CM

## 2017-03-14 DIAGNOSIS — Z3403 Encounter for supervision of normal first pregnancy, third trimester: Secondary | ICD-10-CM

## 2017-03-14 NOTE — Progress Notes (Signed)
Pt complains of lower back pain, pt desires cervical check.

## 2017-03-14 NOTE — Progress Notes (Signed)
ROB at [facility] with complaints of pelvic pressure/lower back pain and irregular but more painful contractions Baby active. LAN in posterior cervical chain is resolving Cervix: tight 3 cm/70%/-1/ vertex Labor precautions ROB in 1 week Farrel Conners, CNM

## 2017-03-19 ENCOUNTER — Ambulatory Visit (INDEPENDENT_AMBULATORY_CARE_PROVIDER_SITE_OTHER): Payer: Medicaid Other | Admitting: Obstetrics & Gynecology

## 2017-03-19 VITALS — BP 120/70 | Wt 161.0 lb

## 2017-03-19 DIAGNOSIS — Z3490 Encounter for supervision of normal pregnancy, unspecified, unspecified trimester: Secondary | ICD-10-CM

## 2017-03-19 DIAGNOSIS — Z3A39 39 weeks gestation of pregnancy: Secondary | ICD-10-CM

## 2017-03-19 NOTE — Patient Instructions (Signed)
Labor Induction Labor induction is when steps are taken to cause a pregnant woman to begin the labor process. Most women go into labor on their own between 37 weeks and 42 weeks of the pregnancy. When this does not happen or when there is a medical need, methods may be used to induce labor. Labor induction causes a pregnant woman's uterus to contract. It also causes the cervix to soften (ripen), open (dilate), and thin out (efface). Usually, labor is not induced before 39 weeks of the pregnancy unless there is a problem with the baby or mother. Before inducing labor, your health care provider will consider a number of factors, including the following:  The medical condition of you and the baby.  How many weeks along you are.  The status of the baby's lung maturity.  The condition of the cervix.  The position of the baby. What are the reasons for labor induction? Labor may be induced for the following reasons:  The health of the baby or mother is at risk.  The pregnancy is overdue by 1 week or more.  The water breaks but labor does not start on its own.  The mother has a health condition or serious illness, such as high blood pressure, infection, placental abruption, or diabetes.  The amniotic fluid amounts are low around the baby.  The baby is distressed. Convenience or wanting the baby to be born on a certain date is not a reason for inducing labor. What methods are used for labor induction? Several methods of labor induction may be used, such as:  Prostaglandin medicine. This medicine causes the cervix to dilate and ripen. The medicine will also start contractions. It can be taken by mouth or by inserting a suppository into the vagina.  Inserting a thin tube (catheter) with a balloon on the end into the vagina to dilate the cervix. Once inserted, the balloon is expanded with water, which causes the cervix to open.  Stripping the membranes. Your health care provider separates  amniotic sac tissue from the cervix, causing the cervix to be stretched and causing the release of a hormone called progesterone. This may cause the uterus to contract. It is often done during an office visit. You will be sent home to wait for the contractions to begin. You will then come in for an induction.  Breaking the water. Your health care provider makes a hole in the amniotic sac using a small instrument. Once the amniotic sac breaks, contractions should begin. This may still take hours to see an effect.  Medicine to trigger or strengthen contractions. This medicine is given through an IV access tube inserted into a vein in your arm. All of the methods of induction, besides stripping the membranes, will be done in the hospital. Induction is done in the hospital so that you and the baby can be carefully monitored. How long does it take for labor to be induced? Some inductions can take up to 2-3 days. Depending on the cervix, it usually takes less time. It takes longer when you are induced early in the pregnancy or if this is your first pregnancy. If a mother is still pregnant and the induction has been going on for 2-3 days, either the mother will be sent home or a cesarean delivery will be needed. What are the risks associated with labor induction? Some of the risks of induction include:  Changes in fetal heart rate, such as too high, too low, or erratic.  Fetal distress.    Chance of infection for the mother and baby.  Increased chance of having a cesarean delivery.  Breaking off (abruption) of the placenta from the uterus (rare).  Uterine rupture (very rare). When induction is needed for medical reasons, the benefits of induction may outweigh the risks. What are some reasons for not inducing labor? Labor induction should not be done if:  It is shown that your baby does not tolerate labor.  You have had previous surgeries on your uterus, such as a myomectomy or the removal of  fibroids.  Your placenta lies very low in the uterus and blocks the opening of the cervix (placenta previa).  Your baby is not in a head-down position.  The umbilical cord drops down into the birth canal in front of the baby. This could cut off the baby's blood and oxygen supply.  You have had a previous cesarean delivery.  There are unusual circumstances, such as the baby being extremely premature. This information is not intended to replace advice given to you by your health care provider. Make sure you discuss any questions you have with your health care provider. Document Released: 10/17/2006 Document Revised: 11/03/2015 Document Reviewed: 12/25/2012 Elsevier Interactive Patient Education  2017 [employer]  

## 2017-03-19 NOTE — Progress Notes (Signed)
PNV, [facility], Labor precautions, IOL discussed if post dates    Sch for 10/19

## 2017-03-24 ENCOUNTER — Encounter: Payer: Self-pay | Admitting: *Deleted

## 2017-03-24 ENCOUNTER — Inpatient Hospital Stay
Admission: EM | Admit: 2017-03-24 | Discharge: 2017-03-25 | DRG: 807 | Disposition: A | Discharge status: Home / Self Care | Payer: Medicaid Other | Attending: Obstetrics and Gynecology | Admitting: Obstetrics and Gynecology

## 2017-03-24 DIAGNOSIS — O9962 Diseases of the digestive system complicating childbirth: Secondary | ICD-10-CM | POA: Diagnosis present

## 2017-03-24 DIAGNOSIS — Z87891 Personal history of nicotine dependence: Secondary | ICD-10-CM | POA: Diagnosis not present

## 2017-03-24 DIAGNOSIS — Z3490 Encounter for supervision of normal pregnancy, unspecified, unspecified trimester: Secondary | ICD-10-CM

## 2017-03-24 DIAGNOSIS — O26893 Other specified pregnancy related conditions, third trimester: Secondary | ICD-10-CM | POA: Diagnosis present

## 2017-03-24 DIAGNOSIS — Z91040 Latex allergy status: Secondary | ICD-10-CM

## 2017-03-24 DIAGNOSIS — Z3A39 39 weeks gestation of pregnancy: Secondary | ICD-10-CM

## 2017-03-24 DIAGNOSIS — K219 Gastro-esophageal reflux disease without esophagitis: Secondary | ICD-10-CM | POA: Diagnosis present

## 2017-03-24 LAB — CBC
HCT: 32.9 % — ABNORMAL LOW (ref 35.0–47.0)
Hemoglobin: 11 g/dL — ABNORMAL LOW (ref 12.0–16.0)
MCH: 27.4 pg (ref 26.0–34.0)
MCHC: 33.5 g/dL (ref 32.0–36.0)
MCV: 81.7 fL (ref 80.0–100.0)
PLATELETS: 362 10*3/uL (ref 150–440)
RBC: 4.02 MIL/uL (ref 3.80–5.20)
RDW: 15.6 % — AB (ref 11.5–14.5)
WBC: 12.2 10*3/uL — ABNORMAL HIGH (ref 3.6–11.0)

## 2017-03-24 LAB — TYPE AND SCREEN
ABO/RH(D): O POS
Antibody Screen: NEGATIVE

## 2017-03-24 MED ORDER — SIMETHICONE 80 MG PO CHEW: 80.0000 mg | CHEWABLE_TABLET | ORAL | Status: DC | PRN

## 2017-03-24 MED ORDER — ONDANSETRON HCL 4 MG PO TABS: 4.0000 mg | ORAL_TABLET | ORAL | Status: DC | PRN

## 2017-03-24 MED ORDER — LIDOCAINE HCL (PF) 1 % IJ SOLN: 30.0000 mL | INTRAMUSCULAR | Status: DC | PRN

## 2017-03-24 MED ORDER — LIDOCAINE HCL (PF) 1 % IJ SOLN
INTRAMUSCULAR | Status: AC
  Filled 2017-03-24: qty 30

## 2017-03-24 MED ORDER — LACTATED RINGERS IV SOLN: 500.0000 mL | INTRAVENOUS | Status: DC | PRN

## 2017-03-24 MED ORDER — HYDROCODONE-ACETAMINOPHEN 5-325 MG PO TABS
1.0000 | ORAL_TABLET | ORAL | Status: DC | PRN
  Administered 2017-03-24 – 2017-03-25 (×6): 1 via ORAL
  Filled 2017-03-24 (×6): qty 1

## 2017-03-24 MED ORDER — ONDANSETRON HCL 4 MG/2ML IJ SOLN: 4.0000 mg | INTRAMUSCULAR | Status: DC | PRN

## 2017-03-24 MED ORDER — OXYTOCIN 10 UNIT/ML IJ SOLN
INTRAMUSCULAR | Status: AC
  Filled 2017-03-24: qty 2

## 2017-03-24 MED ORDER — WITCH HAZEL-GLYCERIN EX PADS: 1.0000 "application " | MEDICATED_PAD | CUTANEOUS | Status: DC | PRN

## 2017-03-24 MED ORDER — BENZOCAINE-MENTHOL 20-0.5 % EX AERO
INHALATION_SPRAY | CUTANEOUS | Status: AC
  Filled 2017-03-24: qty 56

## 2017-03-24 MED ORDER — DIPHENHYDRAMINE HCL 25 MG PO CAPS: 25.0000 mg | ORAL_CAPSULE | Freq: Four times a day (QID) | ORAL | Status: DC | PRN

## 2017-03-24 MED ORDER — OXYTOCIN 10 UNIT/ML IJ SOLN: 10.0000 [IU] | Freq: Once | INTRAMUSCULAR | Status: DC

## 2017-03-24 MED ORDER — OXYTOCIN BOLUS FROM INFUSION
500.0000 mL | Freq: Once | INTRAVENOUS | Status: AC
  Administered 2017-03-24: 500 mL via INTRAVENOUS

## 2017-03-24 MED ORDER — SENNOSIDES-DOCUSATE SODIUM 8.6-50 MG PO TABS: 2.0000 | ORAL_TABLET | ORAL | Status: DC

## 2017-03-24 MED ORDER — FENTANYL CITRATE (PF) 100 MCG/2ML IJ SOLN
INTRAMUSCULAR | Status: AC
  Administered 2017-03-24: 50 ug
  Filled 2017-03-24: qty 2

## 2017-03-24 MED ORDER — ONDANSETRON HCL 4 MG/2ML IJ SOLN: 4.0000 mg | Freq: Four times a day (QID) | INTRAMUSCULAR | Status: DC | PRN

## 2017-03-24 MED ORDER — MISOPROSTOL 200 MCG PO TABS
ORAL_TABLET | ORAL | Status: AC
  Filled 2017-03-24: qty 4

## 2017-03-24 MED ORDER — IBUPROFEN 600 MG PO TABS
600.0000 mg | ORAL_TABLET | Freq: Four times a day (QID) | ORAL | Status: DC
  Administered 2017-03-24 – 2017-03-25 (×6): 600 mg via ORAL
  Filled 2017-03-24 (×6): qty 1

## 2017-03-24 MED ORDER — SOD CITRATE-CITRIC ACID 500-334 MG/5ML PO SOLN: 30.0000 mL | ORAL | Status: DC | PRN

## 2017-03-24 MED ORDER — COCONUT OIL OIL
1.0000 "application " | TOPICAL_OIL | Status: DC | PRN
  Administered 2017-03-25: 1 via TOPICAL
  Filled 2017-03-24: qty 120

## 2017-03-24 MED ORDER — AMMONIA AROMATIC IN INHA
RESPIRATORY_TRACT | Status: AC
  Filled 2017-03-24: qty 10

## 2017-03-24 MED ORDER — DIBUCAINE 1 % RE OINT: 1.0000 "application " | TOPICAL_OINTMENT | RECTAL | Status: DC | PRN

## 2017-03-24 MED ORDER — PRENATAL MULTIVITAMIN CH
1.0000 | ORAL_TABLET | Freq: Every day | ORAL | Status: DC
  Administered 2017-03-24 – 2017-03-25 (×2): 1 via ORAL
  Filled 2017-03-24 (×2): qty 1

## 2017-03-24 MED ORDER — BENZOCAINE-MENTHOL 20-0.5 % EX AERO: 1.0000 "application " | INHALATION_SPRAY | CUTANEOUS | Status: DC | PRN

## 2017-03-24 MED ORDER — LACTATED RINGERS IV SOLN: INTRAVENOUS | Status: DC

## 2017-03-24 MED ORDER — SENNOSIDES-DOCUSATE SODIUM 8.6-50 MG PO TABS
2.0000 | ORAL_TABLET | ORAL | Status: DC
  Administered 2017-03-25: 2 via ORAL
  Filled 2017-03-24: qty 2

## 2017-03-24 MED ORDER — FERROUS SULFATE 325 (65 FE) MG PO TABS
325.0000 mg | ORAL_TABLET | Freq: Two times a day (BID) | ORAL | Status: DC
  Administered 2017-03-24 – 2017-03-25 (×2): 325 mg via ORAL
  Filled 2017-03-24 (×2): qty 1

## 2017-03-24 MED ORDER — ACETAMINOPHEN 325 MG PO TABS: 650.0000 mg | ORAL_TABLET | ORAL | Status: DC | PRN

## 2017-03-24 MED ORDER — OXYTOCIN 40 UNITS IN LACTATED RINGERS INFUSION - SIMPLE MED
2.5000 [IU]/h | INTRAVENOUS | Status: DC
  Filled 2017-03-24: qty 1000

## 2017-03-24 NOTE — OB Triage Note (Signed)
Recvd pt from ED. C/o contractions every few minutes that started two days ago and have gotten stronger. Feeling baby move ok. Noticed bloody mucous discharge.

## 2017-03-24 NOTE — Discharge Summary (Signed)
OB Discharge Summary     Patient Name: [name] [name] DOB: 04/28/1986 MRN: [id number]  Date of admission: 03/24/2017 Delivering MD: Thomasene Mohair, MD  Date of Delivery: 03/24/2017  Date of discharge: 03/25/2017 Admitting diagnosis: 39 wks preg contractions Intrauterine pregnancy: [redacted]w[redacted]d     Secondary diagnosis: None     Discharge diagnosis: Term Pregnancy Delivered                                                                                                Post partum procedures:None  Augmentation: none  Complications: None  Hospital course:  Onset of Labor With Vaginal Delivery     31 y.o. yo G3P1011 at [redacted]w[redacted]d was admitted in Active Labor on 03/24/2017. Patient had an uncomplicated labor course as follows:  Membrane Rupture Time/Date: 6:05 AM ,03/24/2017   Intrapartum Procedures: Episiotomy: None [1]                                         Lacerations:  1st degree [2];Vaginal [6]  Patient had a delivery of a Viable infant. 03/24/2017  Information for the patient's newborn:  Marisela, Line [[id number]]  Delivery Method: Vag-Spont    Pateint had an uncomplicated postpartum course.  She is ambulating, tolerating a regular diet, passing flatus, and urinating well. Patient is discharged home in stable condition on 03/25/17.   Physical exam  Vitals:   03/24/17 2034 03/24/17 2357 03/25/17 0418 03/25/17 0737  BP: (!) 94/50 110/64 103/63 109/65  Pulse: 78 94 78 92  Resp:  18 18   Temp:  98.3 F (36.8 C) 98.5 F (36.9 C) 98.1 F (36.7 C)  TempSrc:  Oral Oral Oral  SpO2:  100% 98% 100%  Weight:      Height:       General: alert, cooperative and no distress Lochia: appropriate Uterine Fundus: firm/ U-2/ML/NT  DVT Evaluation: No evidence of DVT seen on physical exam.  Labs: Lab Results  Component Value Date   WBC 16.2 (H) 03/25/2017   HGB 9.5 (L) 03/25/2017   HCT 28.5 (L) 03/25/2017   MCV 81.7 03/25/2017   PLT 302 03/25/2017    Discharge instruction:  per After Visit Summary.  Medications:  Allergies as of 03/25/2017      Reactions   Latex Rash   Sulfa Antibiotics Rash      Medication List    STOP taking these medications   acetaminophen 500 MG tablet Commonly known as:  TYLENOL     TAKE these medications   docusate sodium 100 MG capsule Commonly known as:  COLACE Take 100 mg by mouth 2 (two) times daily.   FUSION 65-65-25-30 MG Caps Take 1 tablet by mouth daily.   HYDROcodone-acetaminophen 5-325 MG tablet Commonly known as:  NORCO/VICODIN Take 1-2 tablets by mouth every 6 (six) hours as needed for moderate pain or severe pain (breakthrough pain).   ibuprofen 600 MG tablet Commonly known as:  ADVIL,MOTRIN Take 1 tablet (600 mg total) by mouth every 6 (six) hours  as needed for headache, mild pain, moderate pain or cramping.   magnesium gluconate 500 MG tablet Commonly known as:  MAGONATE Take 500 mg by mouth 2 (two) times daily.   multivitamin-prenatal 27-0.8 MG Tabs tablet Take 1 tablet by mouth daily at 12 noon.   omeprazole 40 MG capsule Commonly known as:  PRILOSEC Take 1 capsule (40 mg total) by mouth daily.       Diet: routine diet  Activity: Advance as tolerated. Pelvic rest for 4-6 weeks.   Outpatient follow up: Follow-up Information    Conard Novak, MD Follow up in 6 week(s).   Specialty:  Obstetrics and Gynecology Why:  6 week postpartum visit Contact information: [address] [place] [address] [phone]             Postpartum contraception: Lactational amenorrhea method/ withdrawl Rhogam Given postpartum: no Rubella vaccine given postpartum: no Varicella vaccine given postpartum: no TDaP given antepartum or postpartum: 02/12/17, AP  Newborn Data: Live born female/ Dorothy/ Breast  Birth Weight: 6 lb 5.9 oz (2890 g) APGAR: 8, 9  Newborn Delivery   Birth date/time:  03/24/2017 06:29:00 Delivery type:  Vaginal, Spontaneous Delivery     Baby Feeding:  Breast  Disposition:home with mother  SIGNED: Farrel Conners, CNM

## 2017-03-24 NOTE — Plan of Care (Signed)
Problem: Nutritional: Goal: Dietary intake will improve Outcome: Completed/Met Date Met: 03/24/17 Tolerating Reg. Diet. Goal: Mothers verbalization of comfort with breastfeeding process will improve Outcome: Progressing Obtaining effective latch independently.  Problem: Pain Management: Goal: General experience of comfort will improve and pain level will decrease Outcome: Progressing States effective pain relief on scheduled Ibuprofen and PRN Percocet.  Problem: Urinary Elimination: Goal: Ability to reestablish a normal urinary elimination pattern will improve Outcome: Completed/Met Date Met: 03/24/17 Voiding without difficulty.

## 2017-03-24 NOTE — H&P (Signed)
OB History & Physical   History of Present Illness:  Chief Complaint: contractions  HPI:  [name] [name] is a 31 y.o. G16P1011 female at [redacted]w[redacted]d dated by LMP.  Her pregnancy has been without complication.    She reports contractions.   She denies leakage of fluid.   She denies vaginal bleeding.   She reports fetal movement.    Maternal Medical History:   Past Medical History:  Diagnosis Date  . Gestational diabetes   . Migraines   . Reflux     Past Surgical History:  Procedure Laterality Date  . DILATION AND CURETTAGE OF UTERUS    . LAPAROSCOPY      Allergies  Allergen Reactions  . Latex Rash  . Sulfa Antibiotics Rash    Prior to Admission medications   Medication Sig Start Date End Date Taking? Authorizing Provider  acetaminophen (TYLENOL) 500 MG tablet Take 1,000 mg by mouth every 6 (six) hours as needed.   Yes [provider]  docusate sodium (COLACE) 100 MG capsule Take 100 mg by mouth 2 (two) times daily.   Yes [provider]  magnesium gluconate (MAGONATE) 500 MG tablet Take 500 mg by mouth 2 (two) times daily.   Yes [provider]  omeprazole (PRILOSEC) 40 MG capsule Take 1 capsule (40 mg total) by mouth daily. 10/29/16 10/29/17 Yes Vena Austria, MD  Prenatal Vit-Fe Fumarate-FA (MULTIVITAMIN-PRENATAL) 27-0.8 MG TABS tablet Take 1 tablet by mouth daily at 12 noon.   Yes [provider]    OB History  Gravida Para Term Preterm AB Living  SAB TAB Ectopic Multiple Live Births      1        # Outcome Date GA Lbr Len/2nd Weight Sex Delivery Anes PTL Lv  3 Current           2 Term 12/09/14 [redacted]w[redacted]d  5 lb 14.4 oz (2.676 kg) F Vag-Spont     1 Ectopic               Prenatal care site: [facility]  Social History: She  reports that she has quit smoking. She quit after 15.00 years of use. She has never used smokeless tobacco. She reports that she does not drink alcohol or use drugs.  Family History: family  history includes Diabetes in her father, maternal grandfather, and paternal grandfather.   Review of Systems: Negative x 10 systems reviewed except as noted in the HPI.    Physical Exam:  Vital Signs: BP 107/60 (BP Location: Left Arm)   Pulse 82   Temp 97.6 F (36.4 C) (Oral)   Resp 18   Ht  (1.6 m)   Wt 161 lb (73 kg)   LMP 06/19/2016   BMI 28.52 kg/m  Constitutional: Well nourished, well developed female in no acute distress.  HEENT: normal Skin: Warm and dry.  Cardiovascular: Regular rate and rhythm.   Extremity: no edema  Respiratory: Clear to auscultation bilateral. Normal respiratory effort Abdomen: FHT present and gravid/ tender with contractions Back: no CVAT Neuro: DTRs 2+, Cranial nerves grossly intact Psych: Alert and Oriented x3. No memory deficits. Normal mood and affect.  MS: normal gait, normal bilateral lower extremity ROM/strength/stability.  Pelvic exam: (female chaperone present) Cervix on arrival 5cm per RN. My check a couple hours after admission is 9cm/BBOW  AROM - moderate meconium  Pertinent Results:  Prenatal Labs: Blood type/Rh O positive  Antibody screen negative  Rubella Immune  Varicella Immune    RPR NR  HBsAg negative  HIV negative  GC negative  Chlamydia negative  Genetic screening Declined  1 hour GTT Early 118, third trim: 190, pass 3 hour gtt with only failing 1 value (1 hour)  3 hour GTT 85/183/154/115  GBS negative on 02/28/17   Baseline FHR: 140 beats/min   Variability: moderate   Accelerations: present   Decelerations: absent Contractions: present frequency: 4-5 q 10 min Overall assessment: cat 1  Assessment:  [name] [name] is a 15 y.o. G32P1011 female at [redacted]w[redacted]d with regular labor.   Plan:  1. Admit to Labor & Delivery  2. CBC, T&S, Clrs, IVF 3. GBS negative.   4. Fetwal well-being: reassuring 5. Meconium stained amniotic fluid: Pediatrics aware.   Thomasene Mohair, MD 03/24/2017 6:10 AM

## 2017-03-25 LAB — CBC
HEMATOCRIT: 28.5 % — AB (ref 35.0–47.0)
HEMOGLOBIN: 9.5 g/dL — AB (ref 12.0–16.0)
MCH: 27.3 pg (ref 26.0–34.0)
MCHC: 33.5 g/dL (ref 32.0–36.0)
MCV: 81.7 fL (ref 80.0–100.0)
PLATELETS: 302 10*3/uL (ref 150–440)
RBC: 3.49 MIL/uL — AB (ref 3.80–5.20)
RDW: 15.3 % — ABNORMAL HIGH (ref 11.5–14.5)
WBC: 16.2 10*3/uL — AB (ref 3.6–11.0)

## 2017-03-25 MED ORDER — FUSION 65-65-25-30 MG PO CAPS: 1.0000 | ORAL_CAPSULE | Freq: Every day | ORAL | 1 refills | Status: DC

## 2017-03-25 MED ORDER — IBUPROFEN 600 MG PO TABS: 600.0000 mg | ORAL_TABLET | Freq: Four times a day (QID) | ORAL | 1 refills | Status: DC | PRN

## 2017-03-25 MED ORDER — HYDROCODONE-ACETAMINOPHEN 5-325 MG PO TABS: 1.0000 | ORAL_TABLET | Freq: Four times a day (QID) | ORAL | 0 refills | Status: DC | PRN

## 2017-03-25 NOTE — Progress Notes (Signed)
Post Partum Day 1 Subjective: no complaints, up ad lib, voiding and tolerating PO. Breast feeding going well. Would like to go home today  Objective: Blood pressure 109/65, pulse 92, temperature 98.1 F (36.7 C), temperature source Oral, resp. rate 18, height  (1.6 m), weight 73 kg (161 lb), last menstrual period 06/19/2016, SpO2 100 %, unknown if currently breastfeeding.  Physical Exam:  General: alert, cooperative and no distress Lochia: appropriate Uterine Fundus: firm/ U-2/ ML/ NT  DVT Evaluation: No evidence of DVT seen on physical exam.   Recent Labs  03/24/17 0352 03/25/17 0719  HGB 11.0* 9.5*  HCT 32.9* 28.5*  WBC 12.2* 16.2*  PLT 362 302    Assessment/Plan: Stable ppd #1 Discharge home today if baby discharged Discharge instructions and prescriptions given to patient Breastfeeding  O POS/ RI/ VI TDAP UTD LAM/ withdrawl   LOS: 1 day   Farrel Conners 03/25/2017, 10:33 AM

## 2017-03-25 NOTE — Progress Notes (Signed)
Patient discharged home with infant. Discharge instructions, prescriptions and follow up appointment given to and reviewed with patient. Patient verbalized understanding. Patient and infant wheeled out by auxiliary.

## 2017-03-26 ENCOUNTER — Encounter: Payer: Medicaid Other | Admitting: Obstetrics & Gynecology

## 2017-03-26 LAB — RPR: RPR: NONREACTIVE

## 2017-05-17 ENCOUNTER — Ambulatory Visit (INDEPENDENT_AMBULATORY_CARE_PROVIDER_SITE_OTHER): Payer: Medicaid Other | Admitting: Obstetrics and Gynecology

## 2017-05-17 ENCOUNTER — Encounter: Payer: Self-pay | Admitting: Obstetrics and Gynecology

## 2017-05-17 NOTE — Progress Notes (Signed)
Postpartum Visit   Chief Complaint  Patient presents with  . 6 week post partum    History of Present Illness: Patient is a 31 y.o. [id number] presents for postpartum visit.  Date of delivery: 03/24/2017 Type of delivery: Vaginal delivery - Vacuum or forceps assisted no Episiotomy No.  Laceration: yes-1st degree Pregnancy or labor problems:  no Any problems since the delivery:  no  Newborn Details:  SINGLETON :  1. Baby's name: . Birth weight: 6.6lb Maternal Details:  Breast Feeding:  yes Post partum depression/anxiety noted:  no Edinburgh Post-Partum Depression Score:  0  Date of last PAP: 10/01/2016 HPV+  Past Medical History:  Diagnosis Date  . Gestational diabetes   . Migraines   . Reflux     Past Surgical History:  Procedure Laterality Date  . DILATION AND CURETTAGE OF UTERUS    . LAPAROSCOPY      Prior to Admission medications   Medication Sig Start Date End Date Taking? Authorizing Provider  docusate sodium (COLACE) 100 MG capsule Take 100 mg by mouth 2 (two) times daily.    [provider]  Fe Fum-Fe Poly-Vit C-Lactobac (FUSION) 65-65-25-30 MG CAPS Take 1 tablet by mouth daily. 03/25/17   Farrel ConnersGutierrez, Colleen, CNM  HYDROcodone-acetaminophen (NORCO/VICODIN) 5-325 MG tablet Take 1-2 tablets by mouth every 6 (six) hours as needed for moderate pain or severe pain (breakthrough pain). 03/25/17   Farrel ConnersGutierrez, Colleen, CNM  ibuprofen (ADVIL,MOTRIN) 600 MG tablet Take 1 tablet (600 mg total) by mouth every 6 (six) hours as needed for headache, mild pain, moderate pain or cramping. 03/25/17   Farrel ConnersGutierrez, Colleen, CNM  magnesium gluconate (MAGONATE) 500 MG tablet Take 500 mg by mouth 2 (two) times daily.    [provider]  omeprazole (PRILOSEC) 40 MG capsule Take 1 capsule (40 mg total) by mouth daily. 10/29/16 10/29/17  Vena AustriaStaebler, Andreas, MD  Prenatal Vit-Fe Fumarate-FA (MULTIVITAMIN-PRENATAL) 27-0.8 MG TABS tablet Take 1 tablet by mouth daily at 12 noon.     [provider]    Allergies  Allergen Reactions  . Latex Rash  . Sulfa Antibiotics Rash     Social History   Socioeconomic History  . Marital status: Single    Spouse name: Not on file  . Number of children: Not on file  . Years of education: Not on file  . Highest education level: Not on file  Social Needs  . Financial resource strain: Not on file  . Food insecurity - worry: Not on file  . Food insecurity - inability: Not on file  . Transportation needs - medical: Not on file  . Transportation needs - non-medical: Not on file  Occupational History  . Not on file  Tobacco Use  . Smoking status: Former Smoker    Years: 15.00  . Smokeless tobacco: Never Used  . Tobacco comment: trying to quit-smokes 1-3 cigarettes/day  Substance and Sexual Activity  . Alcohol use: No    Alcohol/week: 0.0 oz  . Drug use: No  . Sexual activity: Yes    Birth control/protection: None  Other Topics Concern  . Not on file  Social History Narrative  . Not on file    Family History  Problem Relation Age of Onset  . Diabetes Father   . Diabetes Maternal Grandfather   . Diabetes Paternal Grandfather     Review of Systems  Constitutional: Negative.   HENT: Negative.   Eyes: Negative.   Respiratory: Negative.   Cardiovascular: Negative.   Gastrointestinal: Negative.  Genitourinary: Negative.   Musculoskeletal: Negative.   Skin: Negative.   Neurological: Negative.   Psychiatric/Behavioral: Negative.      Physical Exam BP 118/78   Ht 5\' 4"  (1.626 m)   Wt 148 lb (67.1 kg)   LMP 05/16/2017   BMI 25.40 kg/m   Physical Exam  Constitutional: She is oriented to person, place, and time. She appears well-developed and well-nourished. No distress.  Eyes: EOM are normal. No scleral icterus.  Neck: Normal range of motion. Neck supple.  Cardiovascular: Normal rate and regular rhythm.  Pulmonary/Chest: Effort normal and breath sounds normal. No respiratory distress. She has no  wheezes. She has no rales.  Abdominal: Soft. Bowel sounds are normal. She exhibits no distension and no mass. There is no tenderness. There is no rebound and no guarding.  Musculoskeletal: Normal range of motion. She exhibits no edema.  Neurological: She is alert and oriented to person, place, and time. No cranial nerve deficit.  Skin: Skin is warm and dry. No erythema.  Psychiatric: She has a normal mood and affect. Her behavior is normal. Judgment normal.    Female Chaperone present during breast and/or pelvic exam.  Assessment: 31 y.o. [id number] presenting for 6 week postpartum visit  Plan: Problem List Items Addressed This Visit    Postpartum care following vaginal delivery - Primary     1) Contraception Education given regarding options for contraception, including none for now.  2)  Pap - ASCCP guidelines and rational discussed.  Patient opts for routine screening interval. Due in April given last result  3) Patient underwent screening for postpartum depression with no concerns noted.  4) Follow up in April for annual with pap smear/HPV  Thomasene MohairStephen Sheleen Conchas, MD 05/17/2017 2:29 PM

## 2018-03-30 ENCOUNTER — Observation Stay
Admission: EM | Admit: 2018-03-30 | Discharge: 2018-03-31 | Disposition: A | Discharge status: Home / Self Care | Payer: 59 | Attending: General Surgery | Admitting: General Surgery

## 2018-03-30 ENCOUNTER — Emergency Department: Payer: 59

## 2018-03-30 ENCOUNTER — Observation Stay: Payer: 59 | Admitting: Certified Registered"

## 2018-03-30 ENCOUNTER — Other Ambulatory Visit: Payer: Self-pay

## 2018-03-30 ENCOUNTER — Encounter
Admission: EM | Disposition: A | Discharge status: Home / Self Care | Payer: Self-pay | Source: Home / Self Care | Attending: Emergency Medicine

## 2018-03-30 DIAGNOSIS — K3580 Unspecified acute appendicitis: Secondary | ICD-10-CM | POA: Diagnosis not present

## 2018-03-30 DIAGNOSIS — K353 Acute appendicitis with localized peritonitis, without perforation or gangrene: Secondary | ICD-10-CM | POA: Diagnosis present

## 2018-03-30 DIAGNOSIS — K219 Gastro-esophageal reflux disease without esophagitis: Secondary | ICD-10-CM | POA: Diagnosis not present

## 2018-03-30 DIAGNOSIS — Z87891 Personal history of nicotine dependence: Secondary | ICD-10-CM | POA: Insufficient documentation

## 2018-03-30 DIAGNOSIS — Z882 Allergy status to sulfonamides status: Secondary | ICD-10-CM | POA: Insufficient documentation

## 2018-03-30 DIAGNOSIS — Z7401 Bed confinement status: Secondary | ICD-10-CM | POA: Diagnosis not present

## 2018-03-30 DIAGNOSIS — Z79899 Other long term (current) drug therapy: Secondary | ICD-10-CM | POA: Insufficient documentation

## 2018-03-30 HISTORY — PX: LAPAROSCOPIC APPENDECTOMY: SHX408

## 2018-03-30 LAB — COMPREHENSIVE METABOLIC PANEL
ALBUMIN: 4.2 g/dL (ref 3.5–5.0)
ALK PHOS: 68 U/L (ref 38–126)
ALT: 17 U/L (ref 0–44)
ANION GAP: 9 (ref 5–15)
AST: 19 U/L (ref 15–41)
BUN: 9 mg/dL (ref 6–20)
CALCIUM: 9.4 mg/dL (ref 8.9–10.3)
CO2: 23 mmol/L (ref 22–32)
Chloride: 103 mmol/L (ref 98–111)
Creatinine, Ser: 0.47 mg/dL (ref 0.44–1.00)
GFR calc non Af Amer: >60 mL/min (ref >60)
GLUCOSE: 105 mg/dL — AB (ref 70–99)
Potassium: 3.5 mmol/L (ref 3.5–5.1)
SODIUM: 135 mmol/L (ref 135–145)
Total Bilirubin: 0.5 mg/dL (ref 0.3–1.2)
Total Protein: 7.6 g/dL (ref 6.5–8.1)

## 2018-03-30 LAB — URINALYSIS, COMPLETE (UACMP) WITH MICROSCOPIC
BILIRUBIN URINE: NEGATIVE
Bacteria, UA: NONE SEEN
Glucose, UA: NEGATIVE mg/dL
HGB URINE DIPSTICK: NEGATIVE
Ketones, ur: NEGATIVE mg/dL
Leukocytes, UA: NEGATIVE
NITRITE: NEGATIVE
Protein, ur: NEGATIVE mg/dL
Specific Gravity, Urine: <1.005 — ABNORMAL LOW (ref 1.005–1.030)
pH: 6 (ref 5.0–8.0)

## 2018-03-30 LAB — CBC
HCT: 39.8 % (ref 36.0–46.0)
Hemoglobin: 13.1 g/dL (ref 12.0–15.0)
MCH: 29.4 pg (ref 26.0–34.0)
MCHC: 32.9 g/dL (ref 30.0–36.0)
MCV: 89.4 fL (ref 80.0–100.0)
Platelets: 336 10*3/uL (ref 150–400)
RBC: 4.45 MIL/uL (ref 3.87–5.11)
RDW: 13.4 % (ref 11.5–15.5)
WBC: 20 10*3/uL — ABNORMAL HIGH (ref 4.0–10.5)
nRBC: 0 % (ref 0.0–0.2)

## 2018-03-30 LAB — POCT PREGNANCY, URINE: Preg Test, Ur: NEGATIVE

## 2018-03-30 LAB — LIPASE, BLOOD: Lipase: 28 U/L (ref 11–51)

## 2018-03-30 SURGERY — APPENDECTOMY, LAPAROSCOPIC
Anesthesia: General | Site: Abdomen

## 2018-03-30 MED ORDER — SODIUM CHLORIDE 0.9 % IV SOLN
INTRAVENOUS | Status: AC
  Filled 2018-03-30: qty 20

## 2018-03-30 MED ORDER — PIPERACILLIN-TAZOBACTAM 3.375 G IVPB 30 MIN
3.3750 g | Freq: Once | INTRAVENOUS | Status: AC
  Administered 2018-03-30: 3.375 g via INTRAVENOUS
  Filled 2018-03-30: qty 50

## 2018-03-30 MED ORDER — MORPHINE SULFATE (PF) 4 MG/ML IV SOLN
4.0000 mg | Freq: Once | INTRAVENOUS | Status: AC
  Administered 2018-03-30: 4 mg via INTRAVENOUS
  Filled 2018-03-30: qty 1

## 2018-03-30 MED ORDER — SODIUM CHLORIDE 0.9 % IV SOLN
INTRAVENOUS | Status: AC
  Filled 2018-03-30: qty 10

## 2018-03-30 MED ORDER — IOPAMIDOL (ISOVUE-300) INJECTION 61%
100.0000 mL | Freq: Once | INTRAVENOUS | Status: AC | PRN
  Administered 2018-03-30: 100 mL via INTRAVENOUS

## 2018-03-30 MED ORDER — FENTANYL CITRATE (PF) 100 MCG/2ML IJ SOLN
INTRAMUSCULAR | Status: AC
  Filled 2018-03-30: qty 2

## 2018-03-30 MED ORDER — ROCURONIUM BROMIDE 50 MG/5ML IV SOLN
INTRAVENOUS | Status: AC
  Filled 2018-03-30: qty 1

## 2018-03-30 MED ORDER — ACETAMINOPHEN 325 MG PO TABS: 650.0000 mg | ORAL_TABLET | Freq: Four times a day (QID) | ORAL | Status: DC | PRN

## 2018-03-30 MED ORDER — SODIUM CHLORIDE 0.9 % IV SOLN
Freq: Once | INTRAVENOUS | Status: AC
  Administered 2018-03-30: 14:00:00 via INTRAVENOUS

## 2018-03-30 MED ORDER — SUGAMMADEX SODIUM 200 MG/2ML IV SOLN
INTRAVENOUS | Status: DC | PRN
  Administered 2018-03-30: 150 mg via INTRAVENOUS

## 2018-03-30 MED ORDER — LACTATED RINGERS IV SOLN
INTRAVENOUS | Status: DC | PRN
  Administered 2018-03-30: 18:00:00 via INTRAVENOUS

## 2018-03-30 MED ORDER — HYDROMORPHONE HCL 1 MG/ML IJ SOLN
INTRAMUSCULAR | Status: AC
  Administered 2018-03-30: 0.25 mg via INTRAVENOUS
  Filled 2018-03-30: qty 1

## 2018-03-30 MED ORDER — MIDAZOLAM HCL 2 MG/2ML IJ SOLN
INTRAMUSCULAR | Status: DC | PRN
  Administered 2018-03-30: 2 mg via INTRAVENOUS

## 2018-03-30 MED ORDER — PROPOFOL 10 MG/ML IV BOLUS
INTRAVENOUS | Status: AC
  Filled 2018-03-30: qty 40

## 2018-03-30 MED ORDER — LIDOCAINE HCL (CARDIAC) PF 100 MG/5ML IV SOSY
PREFILLED_SYRINGE | INTRAVENOUS | Status: DC | PRN
  Administered 2018-03-30: 70 mg via INTRAVENOUS

## 2018-03-30 MED ORDER — HYDROCODONE-ACETAMINOPHEN 5-325 MG PO TABS
1.0000 | ORAL_TABLET | ORAL | Status: DC | PRN
  Administered 2018-03-31 (×2): 2 via ORAL
  Filled 2018-03-30 (×2): qty 2

## 2018-03-30 MED ORDER — METRONIDAZOLE IN NACL 5-0.79 MG/ML-% IV SOLN
500.0000 mg | Freq: Three times a day (TID) | INTRAVENOUS | Status: DC
  Administered 2018-03-30 – 2018-03-31 (×3): 500 mg via INTRAVENOUS
  Filled 2018-03-30 (×5): qty 100

## 2018-03-30 MED ORDER — ONDANSETRON 4 MG PO TBDP: 4.0000 mg | ORAL_TABLET | Freq: Four times a day (QID) | ORAL | Status: DC | PRN

## 2018-03-30 MED ORDER — PROMETHAZINE HCL 25 MG/ML IJ SOLN: 6.2500 mg | INTRAMUSCULAR | Status: DC | PRN

## 2018-03-30 MED ORDER — FENTANYL CITRATE (PF) 100 MCG/2ML IJ SOLN
INTRAMUSCULAR | Status: DC | PRN
  Administered 2018-03-30 (×2): 100 ug via INTRAVENOUS

## 2018-03-30 MED ORDER — MIDAZOLAM HCL 2 MG/2ML IJ SOLN
INTRAMUSCULAR | Status: AC
  Filled 2018-03-30: qty 2

## 2018-03-30 MED ORDER — LIDOCAINE HCL (PF) 2 % IJ SOLN
INTRAMUSCULAR | Status: AC
  Filled 2018-03-30: qty 10

## 2018-03-30 MED ORDER — SUCCINYLCHOLINE CHLORIDE 20 MG/ML IJ SOLN
INTRAMUSCULAR | Status: DC | PRN
  Administered 2018-03-30: 80 mg via INTRAVENOUS

## 2018-03-30 MED ORDER — DEXAMETHASONE SODIUM PHOSPHATE 10 MG/ML IJ SOLN
INTRAMUSCULAR | Status: AC
  Filled 2018-03-30: qty 1

## 2018-03-30 MED ORDER — PROPOFOL 10 MG/ML IV BOLUS
INTRAVENOUS | Status: DC | PRN
  Administered 2018-03-30: 150 mg via INTRAVENOUS

## 2018-03-30 MED ORDER — SEVOFLURANE IN SOLN
RESPIRATORY_TRACT | Status: AC
  Filled 2018-03-30: qty 250

## 2018-03-30 MED ORDER — ENOXAPARIN SODIUM 40 MG/0.4ML ~~LOC~~ SOLN
40.0000 mg | SUBCUTANEOUS | Status: DC
  Administered 2018-03-30: 40 mg via SUBCUTANEOUS
  Filled 2018-03-30: qty 0.4

## 2018-03-30 MED ORDER — SODIUM CHLORIDE 0.9 % IV SOLN
INTRAVENOUS | Status: DC
  Administered 2018-03-30 – 2018-03-31 (×2): via INTRAVENOUS

## 2018-03-30 MED ORDER — DEXAMETHASONE SODIUM PHOSPHATE 10 MG/ML IJ SOLN
INTRAMUSCULAR | Status: DC | PRN
  Administered 2018-03-30: 5 mg via INTRAVENOUS

## 2018-03-30 MED ORDER — ONDANSETRON HCL 4 MG/2ML IJ SOLN
4.0000 mg | Freq: Once | INTRAMUSCULAR | Status: AC
  Administered 2018-03-30: 4 mg via INTRAVENOUS
  Filled 2018-03-30: qty 2

## 2018-03-30 MED ORDER — BUPIVACAINE-EPINEPHRINE (PF) 0.5% -1:200000 IJ SOLN
INTRAMUSCULAR | Status: AC
  Filled 2018-03-30: qty 30

## 2018-03-30 MED ORDER — HYDROMORPHONE HCL 1 MG/ML IJ SOLN
0.2500 mg | INTRAMUSCULAR | Status: DC | PRN
  Administered 2018-03-30 (×7): 0.25 mg via INTRAVENOUS

## 2018-03-30 MED ORDER — ROCURONIUM BROMIDE 100 MG/10ML IV SOLN
INTRAVENOUS | Status: DC | PRN
  Administered 2018-03-30: 25 mg via INTRAVENOUS
  Administered 2018-03-30: 15 mg via INTRAVENOUS
  Administered 2018-03-30: 5 mg via INTRAVENOUS

## 2018-03-30 MED ORDER — KETOROLAC TROMETHAMINE 30 MG/ML IJ SOLN
INTRAMUSCULAR | Status: AC
  Filled 2018-03-30: qty 1

## 2018-03-30 MED ORDER — ONDANSETRON HCL 4 MG/2ML IJ SOLN
4.0000 mg | Freq: Four times a day (QID) | INTRAMUSCULAR | Status: DC | PRN
  Administered 2018-03-30: 4 mg via INTRAVENOUS

## 2018-03-30 MED ORDER — SUGAMMADEX SODIUM 200 MG/2ML IV SOLN
INTRAVENOUS | Status: AC
  Filled 2018-03-30: qty 2

## 2018-03-30 MED ORDER — ONDANSETRON HCL 4 MG/2ML IJ SOLN
INTRAMUSCULAR | Status: AC
  Filled 2018-03-30: qty 2

## 2018-03-30 MED ORDER — SODIUM CHLORIDE 0.9 % IV SOLN
2.0000 g | INTRAVENOUS | Status: DC
  Administered 2018-03-30: 2 g via INTRAVENOUS
  Filled 2018-03-30: qty 20

## 2018-03-30 MED ORDER — ACETAMINOPHEN 650 MG RE SUPP: 650.0000 mg | Freq: Four times a day (QID) | RECTAL | Status: DC | PRN

## 2018-03-30 MED ORDER — METRONIDAZOLE IN NACL 5-0.79 MG/ML-% IV SOLN
INTRAVENOUS | Status: AC
  Filled 2018-03-30: qty 100

## 2018-03-30 MED ORDER — SUCCINYLCHOLINE CHLORIDE 20 MG/ML IJ SOLN
INTRAMUSCULAR | Status: AC
  Filled 2018-03-30: qty 1

## 2018-03-30 MED ORDER — MORPHINE SULFATE (PF) 4 MG/ML IV SOLN
4.0000 mg | INTRAVENOUS | Status: DC | PRN
  Administered 2018-03-30 – 2018-03-31 (×5): 4 mg via INTRAVENOUS
  Filled 2018-03-30 (×5): qty 1

## 2018-03-30 MED ORDER — MORPHINE SULFATE (PF) 4 MG/ML IV SOLN: 4.0000 mg | Freq: Once | INTRAVENOUS | Status: DC

## 2018-03-30 MED ORDER — BUPIVACAINE-EPINEPHRINE 0.5% -1:200000 IJ SOLN
INTRAMUSCULAR | Status: DC | PRN
  Administered 2018-03-30: 18 mL

## 2018-03-30 SURGICAL SUPPLY — 40 items
APPLIER CLIP LOGIC TI 5 (MISCELLANEOUS) IMPLANT
BLADE SURG SZ11 CARB STEEL (BLADE) ×3 IMPLANT
CANISTER SUCT 1200ML W/VALVE (MISCELLANEOUS) ×3 IMPLANT
CHLORAPREP W/TINT 26ML (MISCELLANEOUS) ×3 IMPLANT
COVER WAND RF STERILE (DRAPES) IMPLANT
CUTTER FLEX LINEAR 45M (STAPLE) ×3 IMPLANT
DERMABOND ADVANCED (GAUZE/BANDAGES/DRESSINGS) ×2
DERMABOND ADVANCED .7 DNX12 (GAUZE/BANDAGES/DRESSINGS) ×1 IMPLANT
ELECT REM PT RETURN 9FT ADLT (ELECTROSURGICAL) ×3
ELECTRODE REM PT RTRN 9FT ADLT (ELECTROSURGICAL) ×1 IMPLANT
GLOVE BIO SURGEON STRL SZ 6.5 (GLOVE) IMPLANT
GLOVE BIO SURGEONS STRL SZ 6.5 (GLOVE)
GLOVE SKINSENSE NS SZ6.5 (GLOVE) ×12
GLOVE SKINSENSE STRL SZ6.5 (GLOVE) ×6 IMPLANT
GOWN STRL REUS W/ TWL LRG LVL3 (GOWN DISPOSABLE) ×2 IMPLANT
GOWN STRL REUS W/TWL LRG LVL3 (GOWN DISPOSABLE) ×4
GRASPER SUT TROCAR 14GX15 (MISCELLANEOUS) ×3 IMPLANT
HANDLE YANKAUER SUCT BULB TIP (MISCELLANEOUS) ×3 IMPLANT
IRRIGATION STRYKERFLOW (MISCELLANEOUS) ×1 IMPLANT
IRRIGATOR STRYKERFLOW (MISCELLANEOUS) ×3
IV NS 1000ML (IV SOLUTION) ×2
IV NS 1000ML BAXH (IV SOLUTION) ×1 IMPLANT
KIT TURNOVER KIT A (KITS) ×3 IMPLANT
LIGASURE LAP MARYLAND 5MM 37CM (ELECTROSURGICAL) ×3 IMPLANT
NEEDLE HYPO 22GX1.5 SAFETY (NEEDLE) ×3 IMPLANT
NEEDLE VERESS 14GA 120MM (NEEDLE) ×3 IMPLANT
NS IRRIG 500ML POUR BTL (IV SOLUTION) ×3 IMPLANT
PACK LAP CHOLECYSTECTOMY (MISCELLANEOUS) ×3 IMPLANT
POUCH ENDO CATCH 10MM SPEC (MISCELLANEOUS) ×3 IMPLANT
RELOAD 45 VASCULAR/THIN (ENDOMECHANICALS) IMPLANT
RELOAD STAPLE TA45 3.5 REG BLU (ENDOMECHANICALS) ×3 IMPLANT
SCISSORS METZENBAUM CVD 33 (INSTRUMENTS) ×3 IMPLANT
SPONGE GAUZE 2X2 8PLY STER LF (GAUZE/BANDAGES/DRESSINGS)
SPONGE GAUZE 2X2 8PLY STRL LF (GAUZE/BANDAGES/DRESSINGS) IMPLANT
SUT MNCRL AB 4-0 PS2 18 (SUTURE) ×3 IMPLANT
SUT VICRYL PLUS ABS 0 54 (SUTURE) ×3 IMPLANT
TRAY FOLEY MTR SLVR 16FR STAT (SET/KITS/TRAYS/PACK) ×3 IMPLANT
TROCAR XCEL 12X100 BLDLESS (ENDOMECHANICALS) ×3 IMPLANT
TROCAR XCEL NON-BLD 5MMX100MML (ENDOMECHANICALS) ×6 IMPLANT
TUBING INSUFFLATION (TUBING) ×3 IMPLANT

## 2018-03-30 NOTE — Anesthesia Postprocedure Evaluation (Signed)
Anesthesia Post Note  Patient: Donnie Aho  Procedure(s) Performed: APPENDECTOMY LAPAROSCOPIC (N/A Abdomen)  Patient location during evaluation: PACU Anesthesia Type: General Level of consciousness: awake and alert Pain management: pain level controlled Vital Signs Assessment: post-procedure vital signs reviewed and stable Respiratory status: spontaneous breathing, nonlabored ventilation, respiratory function stable and patient connected to nasal cannula oxygen Cardiovascular status: blood pressure returned to baseline and stable Postop Assessment: no apparent nausea or vomiting Anesthetic complications: no     Last Vitals:  Vitals:   03/30/18 2037 03/30/18 2111  BP: 105/66 102/61  Pulse: 81 74  Resp: 16 18  Temp: 37.2 C 37.4 C  SpO2: 91% 93%    Last Pain:  Vitals:   03/30/18 2127  TempSrc:   PainSc: 8                  Jovita Gamma

## 2018-03-30 NOTE — Op Note (Signed)
Preoperative diagnosis: Acute appendicitis.  Postoperative diagnosis: Acute appendicitis  Procedure: Laparoscopic appendectomy.  Anesthesia: GETA  Surgeon: Dr. Hazle Quant, MD  Wound Classification: Contaminated  Indications: Patient is a 32 y.o. female  presented with right lower quadrant pain of one day duration, elevated WBC. Computed tomography scan and physical examination were consistent with acute appendicitis.   Findings: 1. Acutely inflamed appendix identified lateral to the liver 2. No peri-appendiceal abscess or phlegmon 3. Appendiceal artery ligated and divided with EndoGIA 4. Adequate hemostasis.   Description of procedure: The patient was placed on the operating table in the supine position. General anesthesia was induced. A time-out was completed verifying correct patient, procedure, site, positioning, and implant(s) and/or special equipment prior to beginning this procedure. A Foley catheter and orogastric tubes were placed. The abdomen was prepped and draped in the usual sterile fashion.  An incision was made in a natural skin line above the umbilicus.   The fascia was elevated and the Veress needle inserted. Proper position was confirmed by aspiration and saline meniscus test.   The abdomen was insufflated with carbon dioxide to a pressure of 15 mmHg. A 12-mm trocar was then inserted. The patient tolerated insufflation well.  The laparoscope was inserted and the abdomen inspected. No injuries from initial trocar placement were noted. Turbid fluid was noted in the right uper quadrant. Under direct visualization a 5-mm port was then placed above the symphysis pubis on midline.  Care was taken to avoid injury to the bladder. The cecum that was in the right upper quadrant was grabbed and the appendix identified in direction lateral to the liver. Due to this position, the third trocar was inserted in the epigastric area (5 mm). The cecum was gently grasped with an endoscopic  gasper. The appendix was then grasped and elevated. It was noted to be inflamed at the tip. With [place], a mesenteric window was create at the base of the appendix. An endoscopic linear cutting stapler was then used to divide and staple the base of the appendix. The mesoappendix was divided with LigaSure device.  The appendix was placed in an endoscopic retrieval bag and removed.  The appendiceal stump was then irrigated and hemostasis was assured. Fluid was suctioned and no other pathology was identified.  Secondary trocars were removed under direct vision. No bleeding was noted. The laparoscope was withdrawn and the umbilical trocar removed. The abdomen was allowed to collapse. All trocar sites greater than 5 mm were closed with Vicryl 0. The skin was closed with subcuticular sutures Monocryl 3-0 of and steristrips.  The patient tolerated the procedure well and was taken to the postanesthesia care unit in satisfactory condition.   Specimen: Appendix  Complications: None  Estimated Blood Loss: 5 mL

## 2018-03-30 NOTE — ED Triage Notes (Addendum)
R sided abd pain since 5am. Nausea. 2 poops today. No diarrhea or vomiting. Still has gallbladder and appendix. A&) x 4. Ambulatory. Denies urinary symptoms.

## 2018-03-30 NOTE — H&P (Signed)
SURGICAL HISTORY AND PHYSICAL NOTE   HISTORY OF PRESENT ILLNESS (HPI):  32 y.o. female presented to [facility] ED for evaluation of abdominal pain. Patient reports pain started today at 5 am. Pain localized on mid abdomen and then radiated to the right quadrant. Pain aggravated with pressure of the abdomen. Pain alleviated with morphine. Pain associated with nausea but no vomiting. Refer chills but no fever. On evaluation at ED patient found with leukocytosis. CT scan was done and shows inflammation of the appendix. I personally reviewed the images.   Surgery is consulted by Dr. Dr. Mayford Knife in this context for evaluation and management of acute appendicitis.  PAST MEDICAL HISTORY (PMH):  Past Medical History:  Diagnosis Date  . Gestational diabetes   . Migraines   . Reflux      PAST SURGICAL HISTORY [facility]):  Past Surgical History:  Procedure Laterality Date  . DILATION AND CURETTAGE OF UTERUS    . LAPAROSCOPY       MEDICATIONS:  Prior to Admission medications   Medication Sig Start Date End Date Taking? Authorizing Provider  docusate sodium (COLACE) 100 MG capsule Take 100 mg by mouth 2 (two) times daily.    [provider]  Fe Fum-Fe Poly-Vit C-Lactobac (FUSION) 65-65-25-30 MG CAPS Take 1 tablet by mouth daily. 03/25/17   Farrel Conners, CNM  HYDROcodone-acetaminophen (NORCO/VICODIN) 5-325 MG tablet Take 1-2 tablets by mouth every 6 (six) hours as needed for moderate pain or severe pain (breakthrough pain). 03/25/17   Farrel Conners, CNM  ibuprofen (ADVIL,MOTRIN) 600 MG tablet Take 1 tablet (600 mg total) by mouth every 6 (six) hours as needed for headache, mild pain, moderate pain or cramping. 03/25/17   Farrel Conners, CNM  magnesium gluconate (MAGONATE) 500 MG tablet Take 500 mg by mouth 2 (two) times daily.    [provider]  omeprazole (PRILOSEC) 40 MG capsule Take 1 capsule (40 mg total) by mouth daily. 10/29/16 10/29/17  Vena Austria, MD  Prenatal  Vit-Fe Fumarate-FA (MULTIVITAMIN-PRENATAL) 27-0.8 MG TABS tablet Take 1 tablet by mouth daily at 12 noon.    [provider]     ALLERGIES:  Allergies  Allergen Reactions  . Latex Rash  . Sulfa Antibiotics Rash     SOCIAL HISTORY:  Social History   Socioeconomic History  . Marital status: Single    Spouse name: Not on file  . Number of children: Not on file  . Years of education: Not on file  . Highest education level: Not on file  Occupational History  . Not on file  Social Needs  . Financial resource strain: Not on file  . Food insecurity:    Worry: Not on file    Inability: Not on file  . Transportation needs:    Medical: Not on file    Non-medical: Not on file  Tobacco Use  . Smoking status: Former Smoker    Years: 15.00  . Smokeless tobacco: Never Used  . Tobacco comment: trying to quit-smokes 1-3 cigarettes/day  Substance and Sexual Activity  . Alcohol use: No    Alcohol/week: 0.0 standard drinks  . Drug use: No  . Sexual activity: Yes    Birth control/protection: None  Lifestyle  . Physical activity:    Days per week: Not on file    Minutes per session: Not on file  . Stress: Not on file  Relationships  . Social connections:    Talks on phone: Not on file    Gets together: Not on  file    Attends religious service: Not on file    Active member of club or organization: Not on file    Attends meetings of clubs or organizations: Not on file    Relationship status: Not on file  . Intimate partner violence:    Fear of current or ex partner: Not on file    Emotionally abused: Not on file    Physically abused: Not on file    Forced sexual activity: Not on file  Other Topics Concern  . Not on file  Social History Narrative  . Not on file    The patient currently resides (home / rehab facility / nursing home): Home The patient normally is (ambulatory / bedbound): Ambulatory   FAMILY HISTORY:  Family History  Problem Relation Age of Onset  .  Diabetes Father   . Diabetes Maternal Grandfather   . Diabetes Paternal Grandfather      REVIEW OF SYSTEMS:  Constitutional: denies weight loss, fever, chills, or sweats  Eyes: denies any other vision changes, history of eye injury  ENT: denies sore throat, hearing problems  Respiratory: denies shortness of breath, wheezing  Cardiovascular: denies chest pain, palpitations  Gastrointestinal: positive for abdominal pain, Nausea. Negative for vomiting or diarrhea Genitourinary: denies burning with urination or urinary frequency Musculoskeletal: denies any other joint pains or cramps  Skin: denies any other rashes or skin discolorations  Neurological: denies any other headache, dizziness, weakness  Psychiatric: denies any other depression, anxiety   All other review of systems were negative   VITAL SIGNS:  Temp:  [98.8 F (37.1 C)] 98.8 F (37.1 C) (10/20 1332) Pulse Rate:  [90] 90 (10/20 1332) Resp:  [18] 18 (10/20 1332) BP: (115-122)/(59-81) 115/73 (10/20 1430) SpO2:  [98 %] 98 % (10/20 1332) Weight:  [70.8 kg] 70.8 kg (10/20 1333)     Height: 5\' 4"  (162.6 cm) Weight: 70.8 kg BMI (Calculated): 26.76   INTAKE/OUTPUT:  This shift: No intake/output data recorded.  Last 2 shifts: @IOLAST2SHIFTS @   PHYSICAL EXAM:  Constitutional:  -- Normal body habitus  -- Awake, alert, and oriented x3  Eyes:  -- Pupils equally round and reactive to light  -- No scleral icterus  Ear, nose, and throat:  -- No jugular venous distension  Pulmonary:  -- No crackles  -- Equal breath sounds bilaterally -- Breathing non-labored at rest Cardiovascular:  -- S1, S2 present  -- No pericardial rubs Gastrointestinal:  -- Abdomen soft, moderate tender on upper and lower quadrant, non-distended, no guarding or rebound tenderness -- No abdominal masses appreciated, pulsatile or otherwise  Musculoskeletal and Integumentary:  -- Wounds or skin discoloration: None appreciated -- Extremities: B/L UE and  LE FROM, hands and feet warm, no edema  Neurologic:  -- Motor function: intact and symmetric -- Sensation: intact and symmetric   Labs:  CBC Latest Ref Rng & Units 03/30/2018 03/25/2017 03/24/2017  WBC 4.0 - 10.5 K/uL 20.0(H) 16.2(H) 12.2(H)  Hemoglobin 12.0 - 15.0 g/dL 40.9 8.1(X) 11.0(L)  Hematocrit 36.0 - 46.0 % 39.8 28.5(L) 32.9(L)  Platelets 150 - 400 K/uL 336 302 362   CMP Latest Ref Rng & Units 03/30/2018 10/04/2015 03/25/2013  Glucose 70 - 99 mg/dL 914(N) 829(F) 621(H)  BUN 6 - 20 mg/dL 9 13 6(L)  Creatinine 0.86 - 1.00 mg/dL 5.78 4.69 6.29(B)  Sodium 135 - 145 mmol/L 135 136 138  Potassium 3.5 - 5.1 mmol/L 3.5 4.1 3.8  Chloride 98 - 111 mmol/L 103 105 107  CO2  22 - 32 mmol/L 23 21(L) 26  Calcium 8.9 - 10.3 mg/dL 9.4 9.8 9.3  Total Protein 6.5 - 8.1 g/dL 7.6 - 7.7  Total Bilirubin 0.3 - 1.2 mg/dL 0.5 - 0.4  Alkaline Phos 38 - 126 U/L 68 - 68  AST 15 - 41 U/L 19 - 24  ALT 0 - 44 U/L 17 - 25   Pregnancy test: negative  Imaging studies:  EXAM: CT ABDOMEN AND PELVIS WITH CONTRAST  TECHNIQUE: Multidetector CT imaging of the abdomen and pelvis was performed using the standard protocol following bolus administration of intravenous contrast.  CONTRAST:  ISOVUE-300 IOPAMIDOL (ISOVUE-300) INJECTION 61%  COMPARISON:  None.  FINDINGS: Lower Chest: No acute findings.  Hepatobiliary: No hepatic masses identified. Gallbladder is unremarkable.  Pancreas:  No mass or inflammatory changes.  Spleen: Within normal limits in size and appearance.  Adrenals/Urinary Tract: No masses identified. No evidence of hydronephrosis.  Stomach/Bowel: The cecum is located in the right upper quadrant. Findings of acute appendicitis are seen as follows:  Appendix: Location: Subhepatic along the posterior and inferior margin of the right hepatic lobe  Diameter: 11 mm  Appendicolith: Absent  Mucosal hyper-enhancement: Absent  Extraluminal Gas:  Absent  Periappendiceal Collection: None  Vascular/Lymphatic: No pathologically enlarged lymph nodes. No abdominal aortic aneurysm.  Reproductive:  No mass or other significant abnormality.  Other:  None.  Musculoskeletal:  No suspicious bone lesions identified.  IMPRESSION: Positive for acute appendicitis, which is located in the subhepatic area of Morison's pouch in the right upper quadrant.  No evidence of abscess or other complication.   Electronically Signed   By: Myles Rosenthal M.D.   On: 03/30/2018 16:08   Assessment/Plan:  32 y.o. female with acute appendicitis. Patient with history, physical exam and images consistent with acute appendicitis. Patient oriented about diagnosis and surgical management as treatment. Patient oriented about goals of surgery and its risk including: bowel injury, infection, abscess, bleeding, leak from cecum, intestinal adhesions, bowel obstruction, fistula, injury to the ureter among others.  Patient understood and agreed to proceed with surgery. Will admit patient, already started on antibiotic therapy, will give IV hydration since patient is NPO and schedule to OR.   Gae Gallop, MD

## 2018-03-30 NOTE — Anesthesia Procedure Notes (Signed)
Procedure Name: Intubation Date/Time: 03/30/2018 6:00 PM Performed by: Chanetta Marshall, CRNA Pre-anesthesia Checklist: Patient identified, Emergency Drugs available, Suction available and Patient being monitored Patient Re-evaluated:Patient Re-evaluated prior to induction Oxygen Delivery Method: Simple face mask, Circle system utilized, Non-rebreather mask and Nasal cannula Preoxygenation: Pre-oxygenation with 100% oxygen Induction Type: IV induction Ventilation: Mask ventilation without difficulty Laryngoscope Size: Mac and 3 Grade View: Grade II Tube type: Oral Tube size: 7.0 mm Number of attempts: 1 Placement Confirmation: ETT inserted through vocal cords under direct vision,  positive ETCO2,  CO2 detector and breath sounds checked- equal and bilateral Secured at: 22 cm Tube secured with: Tape Dental Injury: Teeth and Oropharynx as per pre-operative assessment

## 2018-03-30 NOTE — ED Triage Notes (Signed)
Pt arrives from [facility]. David from [employer] called first RN stating the following: RUQ pain x 8 hours. Tender. 99.3. VSS, UA clear.

## 2018-03-30 NOTE — Anesthesia Preprocedure Evaluation (Addendum)
Anesthesia Evaluation  Patient identified by MRN, date of birth, ID band Patient awake    Reviewed: Allergy & Precautions, H&P , NPO status , Patient's Chart, lab work & pertinent test results  History of Anesthesia Complications Negative for: history of anesthetic complications  Airway Mallampati: II  TM Distance: >3 FB Neck ROM: full    Dental  (+) Teeth Intact   Pulmonary former smoker,    breath sounds clear to auscultation       Cardiovascular negative cardio ROS   Rhythm:regular Rate:Normal     Neuro/Psych  Headaches, negative neurological ROS  negative psych ROS   GI/Hepatic Neg liver ROS, GERD  Medicated and Controlled,  Endo/Other  diabetes (h/o gestational DM)  Renal/GU      Musculoskeletal   Abdominal   Peds  Hematology negative hematology ROS (+)   Anesthesia Other Findings Past Medical History: No date: Gestational diabetes No date: Migraines No date: Reflux  Past Surgical History: No date: DILATION AND CURETTAGE OF UTERUS No date: LAPAROSCOPY  BMI    Body Mass Index:  26.78 kg/m      Reproductive/Obstetrics negative OB ROS                           Anesthesia Physical Anesthesia Plan  ASA: II  Anesthesia Plan: General ETT   Post-op Pain Management:    Induction:   PONV Risk Score and Plan: Ondansetron and Dexamethasone  Airway Management Planned:   Additional Equipment:   Intra-op Plan:   Post-operative Plan:   Informed Consent: I have reviewed the patients History and Physical, chart, labs and discussed the procedure including the risks, benefits and alternatives for the proposed anesthesia with the patient or authorized representative who has indicated his/her understanding and acceptance.   Dental Advisory Given  Plan Discussed with: Anesthesiologist, CRNA and Surgeon  Anesthesia Plan Comments:         Anesthesia Quick Evaluation

## 2018-03-30 NOTE — ED Notes (Signed)
Rocephin and flagyl pulled for surgery. Report given and the orderly on their way. Pt made aware and ambulated to toilet

## 2018-03-30 NOTE — ED Notes (Signed)
Consent signed. Pt offered morphine - declined for now

## 2018-03-30 NOTE — ED Provider Notes (Signed)
[facility] Emergency Department Provider Note       Time seen: ----------------------------------------- 1:37 PM on 03/30/2018 -----------------------------------------   I have reviewed the triage vital signs and the nursing notes.  HISTORY   Chief Complaint Abdominal Pain    HPI [name] [name] is a 32 y.o. female with a history of gestational diabetes, migraines and GERD who presents to the ED for abdominal pain since 5 AM.  She has had some nausea but denies any diarrhea or vomiting.  [facility] and [facility] for further evaluation.  Past Medical History:  Diagnosis Date  . Gestational diabetes   . Migraines   . Reflux     Patient Active Problem List   Diagnosis Date Noted  . Postpartum care following vaginal delivery 03/25/2017  . Encounter for supervision of low-risk pregnancy, antepartum 10/01/2016    Past Surgical History:  Procedure Laterality Date  . DILATION AND CURETTAGE OF UTERUS    . LAPAROSCOPY      Allergies Latex and Sulfa antibiotics  Social History Social History   Tobacco Use  . Smoking status: Former Smoker    Years: 15.00  . Smokeless tobacco: Never Used  . Tobacco comment: trying to quit-smokes 1-3 cigarettes/day  Substance Use Topics  . Alcohol use: No    Alcohol/week: 0.0 standard drinks  . Drug use: No   Review of Systems Constitutional: Negative for fever. Cardiovascular: Negative for chest pain. Respiratory: Negative for shortness of breath. Gastrointestinal: Positive for abdominal pain, nausea Genitourinary: Negative for dysuria. Musculoskeletal: Negative for back pain. Skin: Negative for rash. Neurological: Negative for headaches, focal weakness or numbness.  All systems negative/normal/unremarkable except as stated in the HPI  ____________________________________________   PHYSICAL EXAM:  VITAL SIGNS: ED Triage Vitals  Enc Vitals Group     BP 03/30/18 1332 (!)  115/59     Pulse Rate 03/30/18 1332 90     Resp 03/30/18 1332 18     Temp 03/30/18 1332 98.8 F (37.1 C)     Temp Source 03/30/18 1332 Oral     SpO2 03/30/18 1332 98 %     Weight 03/30/18 1333 156 lb (70.8 kg)     Height 03/30/18 1333 5\' 4"  (1.626 m)     Head Circumference --      Peak Flow --      Pain Score 03/30/18 1333 7     Pain Loc --      Pain Edu? --      Excl. in GC? --    Constitutional: Alert and oriented. Well appearing and in no distress. Eyes: Conjunctivae are normal. Normal extraocular movements. Cardiovascular: Normal rate, regular rhythm. No murmurs, rubs, or gallops. Respiratory: Normal respiratory effort without tachypnea nor retractions. Breath sounds are clear and equal bilaterally. No wheezes/rales/rhonchi. Gastrointestinal: Right mid and lower quadrant tenderness, no rebound or guarding.  Normal bowel sounds. Musculoskeletal: Nontender with normal range of motion in extremities. No lower extremity tenderness nor edema. Neurologic:  Normal speech and language. No gross focal neurologic deficits are appreciated.  Skin:  Skin is warm, dry and intact. No rash noted. Psychiatric: Mood and affect are normal. Speech and behavior are normal.   ____________________________________________  ED COURSE:  As part of my medical decision making, I reviewed the following data within the electronic MEDICAL RECORD NUMBER History obtained from family if available, nursing notes, old chart and ekg, as well as notes from prior ED visits. Patient presented for right  lower quadrant and right mid abdominal pain, we will assess with labs and imaging as indicated at this time.   Procedures ____________________________________________   LABS (pertinent positives/negatives)  Labs Reviewed  COMPREHENSIVE METABOLIC PANEL - Abnormal; Notable for the following components:      Result Value   Glucose, Bld 105 (*)    All other components within normal limits  CBC - Abnormal; Notable for  the following components:   WBC 20.0 (*)    All other components within normal limits  URINALYSIS, COMPLETE (UACMP) WITH MICROSCOPIC - Abnormal; Notable for the following components:   Specific Gravity, Urine <1.005 (*)    All other components within normal limits  LIPASE, BLOOD  POC URINE PREG, ED  POCT PREGNANCY, URINE    RADIOLOGY Images were viewed by me  CT the abdomen pelvis with contrast IMPRESSION: Positive for acute appendicitis, which is located in the subhepatic area of Morison's pouch in the right upper quadrant.  No evidence of abscess or other complication.  ____________________________________________  DIFFERENTIAL DIAGNOSIS   Appendicitis, UTI, pyelonephritis, renal colic, biliary colic, gas pain  FINAL ASSESSMENT AND PLAN  Abdominal pain   Plan: The patient had presented for abdominal pain with nausea. Patient's labs did indicate significant leukocytosis with a white count of 20,000. Patient's imaging was positive for acute appendicitis with appendix and a higher than normal location.  I did order IV Zosyn.  I will discuss with general surgery for admission.   Ulice Dash, MD   Note: This note was generated in part or whole with voice recognition software. Voice recognition is usually quite accurate but there are transcription errors that can and very often do occur. I apologize for any typographical errors that were not detected and corrected.     Emily Filbert, MD 03/30/18 [phone]

## 2018-03-30 NOTE — Anesthesia Post-op Follow-up Note (Signed)
Anesthesia QCDR form completed.        

## 2018-03-30 NOTE — Transfer of Care (Signed)
Immediate Anesthesia Transfer of Care Note  Patient: [name] [name]  Procedure(s) Performed: APPENDECTOMY LAPAROSCOPIC (N/A Abdomen)  Patient Location: PACU  Anesthesia Type:General  Level of Consciousness: awake, alert  and oriented  Airway & Oxygen Therapy: Patient Spontanous Breathing  Post-op Assessment: Report given to RN and Post -op Vital signs reviewed and stable  Post vital signs: Reviewed and stable  Last Vitals:  Vitals Value Taken Time  BP    Temp    Pulse    Resp    SpO2      Last Pain:  Vitals:   03/30/18 1333  TempSrc:   PainSc: 7          Complications: No apparent anesthesia complications

## 2018-03-31 ENCOUNTER — Encounter: Payer: Self-pay | Admitting: General Surgery

## 2018-03-31 MED ORDER — SIMETHICONE 80 MG PO CHEW
80.0000 mg | CHEWABLE_TABLET | Freq: Once | ORAL | Status: AC
  Administered 2018-03-31: 80 mg via ORAL
  Filled 2018-03-31: qty 1

## 2018-03-31 MED ORDER — HYDROCODONE-ACETAMINOPHEN 5-325 MG PO TABS: 1.0000 | ORAL_TABLET | ORAL | 0 refills | Status: DC | PRN

## 2018-03-31 MED ORDER — SENNOSIDES-DOCUSATE SODIUM 8.6-50 MG PO TABS
2.0000 | ORAL_TABLET | Freq: Once | ORAL | Status: AC
  Administered 2018-03-31: 2 via ORAL
  Filled 2018-03-31: qty 2

## 2018-03-31 MED ORDER — SIMETHICONE 80 MG PO CHEW
160.0000 mg | CHEWABLE_TABLET | Freq: Once | ORAL | Status: AC
  Administered 2018-03-31: 160 mg via ORAL
  Filled 2018-03-31: qty 2

## 2018-03-31 NOTE — Plan of Care (Signed)
Patient's vital signs stable; pain controlled with IV morphine; voiding; tolerating crackers and po fluids; ambulated in hall with assist; IV fluids continued; IV site clear; patient observed sleeping at long intervals.

## 2018-03-31 NOTE — Progress Notes (Signed)
Patient c/o "gas" under lower ribs at 2340; patient sitting up in bed crying and states it "hurts to take a breath because of it"; VSS; lungs clear via auscultation; patient ambulated in hall around mother-baby unit X 3 with nurse and husband assist; pt states she "feels better"; pt states she "burped"; MD paged and called back with update given; new orders received; see epic for details.

## 2018-03-31 NOTE — Discharge Summary (Signed)
  Patient ID: [name] [name] MRN: [id number] DOB/AGE: 10/03/1985 32 y.o.  Admit date: 03/30/2018 Discharge date: 03/31/2018   Discharge Diagnoses:  Active Problems:   Acute appendicitis with localized peritonitis   Procedures: Laparoscopic appendectomy  Hospital Course: Patient tolerated procedure well. Today with pain controlled with oral pain medications. Tolerating diet and ambulating.   Physical Exam  Constitutional: She is well-developed, well-nourished, and in no distress.  HENT:  Head: Normocephalic.  Cardiovascular: Normal rate and regular rhythm.  Pulmonary/Chest: Effort normal and breath sounds normal.  Abdominal: Soft. She exhibits no distension. There is no tenderness. There is no guarding.  Wounds dry and clean. The umbilical wound has a small ecchymosis.    Consults: None   Disposition: Discharge disposition: 01-Home or Self Care       Discharge Instructions    Diet - low sodium heart healthy   Complete by:  As directed      Allergies as of 03/31/2018      Reactions   Latex Rash   Sulfa Antibiotics Hives, Rash   Childhood reaction      Medication List    TAKE these medications   HYDROcodone-acetaminophen 5-325 MG tablet Commonly known as:  NORCO/VICODIN Take 1 tablet by mouth every 4 (four) hours as needed for moderate pain.      Follow-up Information    Carolan Shiver, MD Follow up in 2 week(s).   Specialty:  General Surgery Contact information: [address] [place] [address] [phone]

## 2018-03-31 NOTE — Progress Notes (Signed)
Discharge order received from doctor. Reviewed discharge instructions and prescriptions with patient and answered all questions. Follow up appointment instructions given. Patient verbalized understanding. Patient discharged home via wheelchair by nursing/auxillary.     Dorthy Magnussen Garner, RN  

## 2018-03-31 NOTE — Discharge Instructions (Signed)

## 2018-04-01 LAB — SURGICAL PATHOLOGY

## 2018-04-06 ENCOUNTER — Telehealth: Payer: Self-pay | Admitting: Surgery

## 2018-04-06 NOTE — Telephone Encounter (Signed)
Patient called just now reporting that she's been developing a rash over her abdomen and has been getting worse today.  She's s/p lap appy on 10/20 with Dr. Hazle Quant.  She has not taken any narcotic pain medication since POD#3.  No new medications otherwise.  She describes her rash over her entire abdomen going down to the groin area bilaterally.  This sounds like the spread used for skin prep for surgery.  Uncertain if it was chloroprep or betadine.  Patient just took benadryl and I recommended that she continue this and she can also use benadryl ointment or hydrocortisone ointment.  If there is any worsening, particularly if there is any wheezing, shortness of breath, or difficulty swallowing, I recommended that she come to ED for further evaluation.  She understood this plan.  Henrene Dodge, MD

## 2018-07-07 ENCOUNTER — Ambulatory Visit (INDEPENDENT_AMBULATORY_CARE_PROVIDER_SITE_OTHER): Payer: PRIVATE HEALTH INSURANCE | Admitting: Obstetrics and Gynecology

## 2018-07-07 ENCOUNTER — Other Ambulatory Visit (HOSPITAL_COMMUNITY)
Admission: RE | Admit: 2018-07-07 | Discharge: 2018-07-07 | Disposition: A | Discharge status: Home / Self Care | Payer: 59 | Source: Ambulatory Visit | Attending: Obstetrics and Gynecology | Admitting: Obstetrics and Gynecology

## 2018-07-07 ENCOUNTER — Encounter: Payer: Self-pay | Admitting: Obstetrics and Gynecology

## 2018-07-07 VITALS — BP 122/74 | Ht 64.0 in | Wt 161.0 lb

## 2018-07-07 DIAGNOSIS — N644 Mastodynia: Secondary | ICD-10-CM | POA: Diagnosis not present

## 2018-07-07 DIAGNOSIS — Z124 Encounter for screening for malignant neoplasm of cervix: Secondary | ICD-10-CM | POA: Insufficient documentation

## 2018-07-07 DIAGNOSIS — Z01419 Encounter for gynecological examination (general) (routine) without abnormal findings: Secondary | ICD-10-CM

## 2018-07-07 DIAGNOSIS — Z1331 Encounter for screening for depression: Secondary | ICD-10-CM

## 2018-07-07 DIAGNOSIS — Z1339 Encounter for screening examination for other mental health and behavioral disorders: Secondary | ICD-10-CM

## 2018-07-07 NOTE — Progress Notes (Addendum)
Gynecology Annual Exam  PCP: Patient, No Pcp Per  Chief Complaint  Patient presents with  . Annual Exam   History of Present Illness:  [name] [name] is a 33 y.o. [id number] who LMP was Patient's last menstrual period was 07/04/2018., presents today for her annual examination.  Her menses are regular every 28-30 days, lasting 6 day(s).  Dysmenorrhea none. She does not have intermenstrual bleeding.  She is sexually active. Using nothing for contraception. .  Last Pap: 09/2016  Results were: no abnormalities /neg HPV DNA positive (HPV 16/18 negative) Hx of STDs: none  There is no FH of breast cancer. There is no FH of ovarian cancer. The patient does do self-breast exams.  Tobacco use:  Continues to smoke - about 5 cigarettes each day Alcohol use: social drinker Exercise: not active  The patient wears seatbelts: yes.   The patient reports that domestic violence in her life is absent.   She presents with a painful area of her left breast.  It is located just above the nipple on the left side.  The pain started a couple of months ago. She describes the pain as sharp/stabbing.  The pain is rated 5/10. Aggravating factors: pressure.  Alleviating factors: release of pressure.  No associated symptoms.  The pain on her right breast (located in the lower-outer quadrant). The pain seems to come and go.  She notes that she has a history of a cyst on her right side. This current pain is located "deeper" inside her breast.  It also is elicited by pressure and relieved by letting pressure go.  No associated symptoms. Denies leakage from her nipples. Denies overlying skin changes.   Past Medical History:  Diagnosis Date  . Gestational diabetes   . Migraines   . Reflux    Past Surgical History:  Procedure Laterality Date  . DILATION AND CURETTAGE OF UTERUS    . LAPAROSCOPIC APPENDECTOMY N/A 03/30/2018   Procedure: APPENDECTOMY LAPAROSCOPIC;  Surgeon: Carolan Shiverintron-Diaz, Edgardo, MD;  Location:  [facility] ORS;  Service: General;  Laterality: N/A;  . LAPAROSCOPY     Prior to Admission medications: denies   Allergies  Allergen Reactions  . Latex Rash  . Sulfa Antibiotics Hives and Rash    Childhood reaction   Obstetric History: [id number], s/p SVD x 2  Social History   Socioeconomic History  . Marital status: Single    Spouse name: Not on file  . Number of children: Not on file  . Years of education: Not on file  . Highest education level: Not on file  Occupational History  . Not on file  Social Needs  . Financial resource strain: Not on file  . Food insecurity:    Worry: Not on file    Inability: Not on file  . Transportation needs:    Medical: Not on file    Non-medical: Not on file  Tobacco Use  . Smoking status: Current Every Day Smoker    Years: 15.00  . Smokeless tobacco: Never Used  . Tobacco comment: trying to quit-smokes 1-3 cigarettes/day  Substance and Sexual Activity  . Alcohol use: No    Alcohol/week: 0.0 standard drinks  . Drug use: No  . Sexual activity: Yes    Birth control/protection: None  Lifestyle  . Physical activity:    Days per week: Not on file    Minutes per session: Not on file  . Stress: Not on file  Relationships  . Social connections:    Talks  on phone: Not on file    Gets together: Not on file    Attends religious service: Not on file    Active member of club or organization: Not on file    Attends meetings of clubs or organizations: Not on file    Relationship status: Not on file  . Intimate partner violence:    Fear of current or ex partner: Not on file    Emotionally abused: Not on file    Physically abused: Not on file    Forced sexual activity: Not on file  Other Topics Concern  . Not on file  Social History Narrative  . Not on file   Family History  Problem Relation Age of Onset  . Diabetes Father   . Diabetes Maternal Grandfather   . Diabetes Paternal Grandfather     Review of Systems  Constitutional:  Negative.   HENT: Negative.   Eyes: Negative.   Respiratory: Negative.   Cardiovascular: Negative.   Gastrointestinal: Negative.   Genitourinary: Negative.   Musculoskeletal: Negative.   Skin: Negative.   Neurological: Negative.   Psychiatric/Behavioral: Negative.      Physical Exam BP 122/74   Ht 5\' 4"  (1.626 m)   Wt 161 lb (73 kg)   LMP 07/04/2018   BMI 27.64 kg/m    Physical Exam Constitutional:      General: She is not in acute distress.    Appearance: Normal appearance. She is well-developed.  Genitourinary:     Pelvic exam was performed with patient supine.     Vulva, inguinal canal, urethra, bladder and uterus normal.     No inguinal adenopathy present in the right or left side.    No signs of injury in the vagina.     No vaginal discharge, erythema, tenderness or bleeding.     No cervical motion tenderness, discharge, lesion or polyp.     Uterus is mobile.     Uterus is not enlarged or tender.     No uterine mass detected.    Uterus is anteverted.     No right or left adnexal mass present.     Right adnexa not tender or full.     Left adnexa not tender or full.  HENT:     Head: Normocephalic and atraumatic.  Eyes:     General: No scleral icterus.    Conjunctiva/sclera: Conjunctivae normal.  Neck:     Musculoskeletal: Normal range of motion and neck supple.     Thyroid: No thyromegaly.  Cardiovascular:     Rate and Rhythm: Normal rate and regular rhythm.     Heart sounds: No murmur. No friction rub. No gallop.   Pulmonary:     Effort: Pulmonary effort is normal. No respiratory distress.     Breath sounds: Normal breath sounds. No wheezing or rales.  Chest:     Breasts:        Right: Tenderness present. No inverted nipple, mass, nipple discharge or skin change.        Left: Tenderness present. No inverted nipple, mass, nipple discharge or skin change.    Abdominal:     General: Bowel sounds are normal. There is no distension.     Palpations: Abdomen  is soft. There is no mass.     Tenderness: There is no abdominal tenderness. There is no guarding or rebound.  Musculoskeletal: Normal range of motion.        General: No tenderness.  Lymphadenopathy:     Cervical:  No cervical adenopathy.     Lower Body: No right inguinal adenopathy. No left inguinal adenopathy.  Neurological:     Mental Status: She is alert and oriented to person, place, and time.     Cranial Nerves: No cranial nerve deficit.  Skin:    General: Skin is warm and dry.     Findings: No erythema or rash.  Psychiatric:        Mood and Affect: Mood normal.        Behavior: Behavior normal.        Judgment: Judgment normal.     Female chaperone present for pelvic and breast  portions of the physical exam  Results: AUDIT Questionnaire (screen for alcoholism): 7 PHQ-9: 3   Assessment: 33 y.o. [id number] female here for routine annual gynecologic examination  Plan: Problem List Items Addressed This Visit    None    Visit Diagnoses    Women's annual routine gynecological examination    -  Primary   Relevant Orders   Cytology - PAP   MM DIAG BREAST TOMO BILATERAL   Screening for depression       Screening for alcoholism       Pap smear for cervical cancer screening       Relevant Orders   Cytology - PAP   Mastalgia       Relevant Orders   US BREAST LTD UNI RIGHT INC AXILLA   US BREAST LTD UNI LEFT INC AXILLA   MM DIAG BREAST TOMO BILATERAL      Screening: -- Blood pressure screen normal -- Weight screening: overweight: continue to monitor -- Depression screening negative (PHQ-9) -- Nutrition: normal -- cholesterol screening: not due for screening -- osteoporosis screening: not due -- tobacco screening: using: discussed quitting using the 5 A's -- alcohol screening: AUDIT questionnaire indicates low-risk usage. -- family history of breast cancer screening: done. not at high risk. -- no evidence of domestic violence or intimate partner violence. -- STD  screening: gonorrhea/chlamydia NAAT not collected per patient request. -- pap smear collected per ASCCP guidelines   Mastalgia: no masses palpated. However, given severity of symptoms, will get breast imaging to investigate.   bilateral breast imaging (11 o'clock on left, 4-5 cm from nipple), (right, deep to nipple, nearly to chest wall)  15 minutes spent in face to face discussion with > 50% spent in counseling,management, and coordination of care of her mastalgia in addition to her routine screening gynecologic exam.   Thomasene MohairStephen , MD 07/09/2018 5:08 PM

## 2018-07-09 ENCOUNTER — Encounter: Payer: Self-pay | Admitting: Obstetrics and Gynecology

## 2018-07-09 NOTE — Addendum Note (Signed)
Addended by: Thomasene Mohair D on: 07/09/2018 05:08 PM   Modules accepted: Orders

## 2018-07-10 LAB — CYTOLOGY - PAP
Diagnosis: NEGATIVE
HPV: NOT DETECTED

## 2018-07-15 ENCOUNTER — Ambulatory Visit
Admission: RE | Admit: 2018-07-15 | Discharge: 2018-07-15 | Disposition: A | Discharge status: Home / Self Care | Payer: 59 | Source: Ambulatory Visit | Attending: Obstetrics and Gynecology | Admitting: Obstetrics and Gynecology

## 2018-07-15 ENCOUNTER — Telehealth: Payer: Self-pay | Admitting: Obstetrics and Gynecology

## 2018-07-15 ENCOUNTER — Other Ambulatory Visit: Payer: Self-pay | Admitting: Obstetrics and Gynecology

## 2018-07-15 DIAGNOSIS — Z01419 Encounter for gynecological examination (general) (routine) without abnormal findings: Secondary | ICD-10-CM

## 2018-07-15 DIAGNOSIS — N644 Mastodynia: Secondary | ICD-10-CM | POA: Diagnosis present

## 2018-07-15 DIAGNOSIS — N6320 Unspecified lump in the left breast, unspecified quadrant: Secondary | ICD-10-CM

## 2018-07-15 NOTE — Telephone Encounter (Signed)
Patient is aware of appointment at [facility] on Thursday, 01/15/19 @ 2:40p.m.

## 2018-07-20 ENCOUNTER — Encounter: Payer: Self-pay | Admitting: Radiology

## 2018-07-20 ENCOUNTER — Emergency Department: Payer: 59

## 2018-07-20 ENCOUNTER — Other Ambulatory Visit: Payer: Self-pay

## 2018-07-20 ENCOUNTER — Emergency Department
Admission: EM | Admit: 2018-07-20 | Discharge: 2018-07-20 | Disposition: A | Discharge status: Home / Self Care | Payer: 59 | Attending: Emergency Medicine | Admitting: Emergency Medicine

## 2018-07-20 DIAGNOSIS — F17210 Nicotine dependence, cigarettes, uncomplicated: Secondary | ICD-10-CM | POA: Diagnosis not present

## 2018-07-20 DIAGNOSIS — J029 Acute pharyngitis, unspecified: Secondary | ICD-10-CM

## 2018-07-20 DIAGNOSIS — J039 Acute tonsillitis, unspecified: Secondary | ICD-10-CM | POA: Insufficient documentation

## 2018-07-20 HISTORY — DX: Personal history of other diseases of the respiratory system: Z87.09

## 2018-07-20 LAB — CBC WITH DIFFERENTIAL/PLATELET
Abs Immature Granulocytes: 0.19 10*3/uL — ABNORMAL HIGH (ref 0.00–0.07)
Basophils Absolute: 0.1 10*3/uL (ref 0.0–0.1)
Basophils Relative: 0 %
Eosinophils Absolute: 0 10*3/uL (ref 0.0–0.5)
Eosinophils Relative: 0 %
HCT: 37.2 % (ref 36.0–46.0)
Hemoglobin: 12.5 g/dL (ref 12.0–15.0)
Immature Granulocytes: 1 %
LYMPHS ABS: 1.3 10*3/uL (ref 0.7–4.0)
Lymphocytes Relative: 6 %
MCH: 29.3 pg (ref 26.0–34.0)
MCHC: 33.6 g/dL (ref 30.0–36.0)
MCV: 87.1 fL (ref 80.0–100.0)
Monocytes Absolute: 1.5 10*3/uL — ABNORMAL HIGH (ref 0.1–1.0)
Monocytes Relative: 7 %
NRBC: 0 % (ref 0.0–0.2)
Neutro Abs: 20.3 10*3/uL — ABNORMAL HIGH (ref 1.7–7.7)
Neutrophils Relative %: 86 %
Platelets: 268 10*3/uL (ref 150–400)
RBC: 4.27 MIL/uL (ref 3.87–5.11)
RDW: 13.8 % (ref 11.5–15.5)
WBC: 23.3 10*3/uL — ABNORMAL HIGH (ref 4.0–10.5)

## 2018-07-20 LAB — BASIC METABOLIC PANEL
Anion gap: 8 (ref 5–15)
BUN: 9 mg/dL (ref 6–20)
CO2: 24 mmol/L (ref 22–32)
Calcium: 8.8 mg/dL — ABNORMAL LOW (ref 8.9–10.3)
Chloride: 101 mmol/L (ref 98–111)
Creatinine, Ser: 0.55 mg/dL (ref 0.44–1.00)
GFR calc Af Amer: >60 mL/min (ref >60)
GFR calc non Af Amer: >60 mL/min (ref >60)
Glucose, Bld: 128 mg/dL — ABNORMAL HIGH (ref 70–99)
Potassium: 3.7 mmol/L (ref 3.5–5.1)
Sodium: 133 mmol/L — ABNORMAL LOW (ref 135–145)

## 2018-07-20 MED ORDER — SODIUM CHLORIDE 0.9 % IV BOLUS
1000.0000 mL | Freq: Once | INTRAVENOUS | Status: AC
  Administered 2018-07-20: 1000 mL via INTRAVENOUS

## 2018-07-20 MED ORDER — DEXAMETHASONE SODIUM PHOSPHATE 10 MG/ML IJ SOLN
10.0000 mg | Freq: Once | INTRAMUSCULAR | Status: AC
  Administered 2018-07-20: 10 mg via INTRAVENOUS
  Filled 2018-07-20: qty 1

## 2018-07-20 MED ORDER — IOPAMIDOL (ISOVUE-300) INJECTION 61%
75.0000 mL | Freq: Once | INTRAVENOUS | Status: AC | PRN
  Administered 2018-07-20: 75 mL via INTRAVENOUS

## 2018-07-20 MED ORDER — SODIUM CHLORIDE 0.9 % IV SOLN
2.0000 g | INTRAVENOUS | Status: AC
  Administered 2018-07-20: 2 g via INTRAVENOUS
  Filled 2018-07-20: qty 20

## 2018-07-20 NOTE — ED Notes (Signed)
Pt to the er for inability to swallow her antibiotics for her strep throat. Pt reports unable to swallow water or tylenol but pt is maintaining her own secretions. Whit spots noted in the back of throat. Pt is warm to the touch. Advised pt the MD may could write for tylenol suppositories and IM bicillan if she is not allergic. Pt is agreeable to injection.

## 2018-07-20 NOTE — ED Provider Notes (Signed)
[facility] Emergency Department Provider Note  ____________________________________________   First MD Initiated Contact with Patient 07/20/18 [phone]     (approximate)  I have reviewed the triage vital signs and the nursing notes.   HISTORY  Chief Complaint Sore Throat    HPI [name] [name] is a 33 y.o. female with medical history as listed below which notably includes multiple prior episodes of peritonsillar abscesses. She presents by private vehicle with today for evaluation of sore throat and difficulty swallowing.  She went to an urgent care earlier today and tested negative for strep and was started on Augmentin, but she says she cannot swallow the pills because they are too big and she feels like they are going to get stuck.  She has had fever, decreased oral intake, and the pain is primarily in the left side of her throat.  She says it feels similar to when she had to have peritonsillar abscesses drained at least twice in the past by Dr. Willeen Cass with ENT.  Nothing particular makes his symptoms better or worse.  She has not had anything except for a single dose of antibiotics that she was able to take at the urgent care.  She denies chest pain or shortness of breath.  She has had some nausea but no vomiting.  Past Medical History:  Diagnosis Date  . Gestational diabetes   . History of peritonsillar abscess   . Migraines   . Reflux     Patient Active Problem List   Diagnosis Date Noted  . Acute appendicitis with localized peritonitis 03/30/2018  . Postpartum care following vaginal delivery 03/25/2017  . Encounter for supervision of low-risk pregnancy, antepartum 10/01/2016    Past Surgical History:  Procedure Laterality Date  . DILATION AND CURETTAGE OF UTERUS    . LAPAROSCOPIC APPENDECTOMY N/A 03/30/2018   Procedure: APPENDECTOMY LAPAROSCOPIC;  Surgeon: Carolan Shiver, MD;  Location: [facility] ORS;  Service: General;  Laterality: N/A;    . LAPAROSCOPY      Prior to Admission medications   Not on File    Allergies Other; Latex; and Sulfa antibiotics  Family History  Problem Relation Age of Onset  . Diabetes Father   . Diabetes Maternal Grandfather   . Diabetes Paternal Grandfather   . Breast cancer Maternal Aunt     Social History Social History   Tobacco Use  . Smoking status: Current Every Day Smoker    Years: 15.00  . Smokeless tobacco: Never Used  . Tobacco comment: trying to quit-smokes 1-3 cigarettes/day  Substance Use Topics  . Alcohol use: No    Alcohol/week: 0.0 standard drinks  . Drug use: No    Review of Systems Constitutional: + Fever Eyes: No visual changes. ENT: Sore throat and painful swallowing most notable on the left. Cardiovascular: Denies chest pain. Respiratory: Denies shortness of breath. Gastrointestinal: No abdominal pain.  No nausea, no vomiting.  No diarrhea.  No constipation. Genitourinary: Negative for dysuria. Musculoskeletal: Negative for neck pain.  Negative for back pain. Integumentary: Negative for rash. Neurological: Negative for headaches, focal weakness or numbness.   ____________________________________________   PHYSICAL EXAM:  VITAL SIGNS: ED Triage Vitals  Enc Vitals Group     BP 07/20/18 0032 (!) 111/58     Pulse Rate 07/20/18 0032 100     Resp 07/20/18 0032 16     Temp 07/20/18 0032 100 F (37.8 C)     Temp Source 07/20/18 0032 Oral     SpO2 07/20/18  0032 100 %     Weight 07/20/18 0033 72.6 kg (160 lb)     Height 07/20/18 0033 1.626 m (5\' 4" )     Head Circumference --      Peak Flow --      Pain Score 07/20/18 0033 10     Pain Loc --      Pain Edu? --      Excl. in GC? --     Constitutional: Alert and oriented.  Appears acutely uncomfortable but is not in distress. Eyes: Conjunctivae are normal.  Head: Atraumatic. Nose: No congestion/rhinnorhea. Mouth/Throat: Mucous membranes are moist.  Oropharynx is erythematous and she has a  fullness of the left peritonsillar region concerning for early peritonsillar abscess.  There is no exudate on her tonsils but there is what appears to be some tonsilloliths.  No hot potato voice and voice is not hoarse Neck: No stridor.  No meningeal signs.   Cardiovascular: Mild tachycardia, regular rhythm. Good peripheral circulation. Grossly normal heart sounds. Respiratory: Normal respiratory effort.  No retractions. Lungs CTAB. Gastrointestinal: Soft and nontender. No distention.  Musculoskeletal: No lower extremity tenderness nor edema. No gross deformities of extremities. Neurologic:  Normal speech and language. No gross focal neurologic deficits are appreciated.  Skin:  Skin is warm, dry and intact. No rash noted. Psychiatric: Mood and affect are normal. Speech and behavior are normal.  ____________________________________________   LABS (all labs ordered are listed, but only abnormal results are displayed)  Labs Reviewed  CBC WITH DIFFERENTIAL/PLATELET - Abnormal; Notable for the following components:      Result Value   WBC 23.3 (*)    Neutro Abs 20.3 (*)    Monocytes Absolute 1.5 (*)    Abs Immature Granulocytes 0.19 (*)    All other components within normal limits  BASIC METABOLIC PANEL - Abnormal; Notable for the following components:   Sodium 133 (*)    Glucose, Bld 128 (*)    Calcium 8.8 (*)    All other components within normal limits   ____________________________________________  EKG  None - EKG not ordered by ED physician ____________________________________________  RADIOLOGY   ED MD interpretation: Acute tonsillitis/pharyngitis but without discrete fluid collection  Official radiology report(s): Ct Soft Tissue Neck W Contrast  Result Date: 07/20/2018 CLINICAL DATA:  33 y/o F; sore throat, can swallow. Suspect. Tonsillar abscess. EXAM: CT NECK WITH CONTRAST TECHNIQUE: Multidetector CT imaging of the neck was performed using the standard protocol  following the bolus administration of intravenous contrast. CONTRAST:  75mL ISOVUE-300 IOPAMIDOL (ISOVUE-300) INJECTION 61% COMPARISON:  None. FINDINGS: Pharynx and larynx: Enlargement, enhancement, and striated appearance of the lingual, palatine, and adenoid tonsils compatible with acute tonsillitis. No findings of epiglottitis or laryngitis. No discrete abscess identified. No retropharyngeal fluid collection. Mucosal enhancement of the oropharynx. Salivary glands: No inflammation, mass, or stone. Thyroid: Normal. Lymph nodes: Mildly cervical lymphadenopathy. No lymph node necrosis or lymph node abscess. Vascular: Negative. Limited intracranial: Negative. Visualized orbits: Negative. Mastoids and visualized paranasal sinuses: Clear. Skeleton: No acute or aggressive process. Upper chest: Negative. Other: None. IMPRESSION: Acute tonsillitis and pharyngitis. No epiglottitis, laryngitis, retropharyngeal fluid collection, or discrete abscess identified. Reactive cervical lymphadenopathy. Electronically Signed   By: Mitzi HansenLance  Furusawa-Stratton M.D.   On: 07/20/2018 04:39    ____________________________________________   PROCEDURES  Critical Care performed: No   Procedure(s) performed:   Procedures   ____________________________________________   INITIAL IMPRESSION / ASSESSMENT AND PLAN / ED COURSE  As part of my medical decision  making, I reviewed the following data within the electronic MEDICAL RECORD NUMBER Nursing notes reviewed and incorporated, Labs reviewed , Old chart reviewed, Notes from prior ED visits and [place] Controlled Substance Database    Differential diagnosis includes, but is not limited to, peritonsillar abscess, retropharyngeal abscess or infection, nonspecific tonsillitis, influenza or other influenza-like illness.  Based on physical exam I am concerned about a developing peritonsillar abscess and this would be consistent with her symptoms in the past.  I will place an IV, give 1 L  normal saline, and will start with Decadron 10 mg IV and ceftriaxone 2 g IV while obtaining a CT scan of the neck after her lab work comes back.  She does not require airway protection or urgent drainage of the peritonsillar abscess at this time.  I will reassess after lab work and imaging and the patient understands and agrees with the plan.  Clinical Course as of Jul 20 510  Sun Jul 20, 2018  0403 WBC(!): 23.3 [CF]  0403 Generally reassuring metabolic panel.  Basic metabolic panel(!) [CF]  [id number] Acute tonsillitis/pharyngitis but without any discrete fluid collection.  Her leukocytosis is significant with the fact she has an acute infection but her airway is patent and there is nothing drainable.  She got ceftriaxone 2 g IV and she is comfortable continuing with the Augmentin she was previously prescribed.  I think that since she got a dose of Decadron her swelling and pain will improve and I gave her the choice of prescribing a new antibiotic in liquid form or taking the Augmentin she has and she is comfortable taking the Augmentin.  I gave strict return precautions and encouraged her to follow-up with ENT.  CT Soft Tissue Neck W Contrast [CF]    Clinical Course User Index [CF] Loleta RoseForbach, Cendy Oconnor, MD    ____________________________________________  FINAL CLINICAL IMPRESSION(S) / ED DIAGNOSES  Final diagnoses:  Acute pharyngitis, unspecified etiology  Acute tonsillitis, unspecified etiology     MEDICATIONS GIVEN DURING THIS VISIT:  Medications  sodium chloride 0.9 % bolus 1,000 mL (1,000 mLs Intravenous New Bag/Given 07/20/18 0342)  dexamethasone (DECADRON) injection 10 mg (10 mg Intravenous Given 07/20/18 0341)  cefTRIAXone (ROCEPHIN) 2 g in sodium chloride 0.9 % 100 mL IVPB (2 g Intravenous New Bag/Given 07/20/18 0342)  iopamidol (ISOVUE-300) 61 % injection 75 mL (75 mLs Intravenous Contrast Given 07/20/18 0433)     ED Discharge Orders    None       Note:  This document was prepared  using Dragon voice recognition software and may include unintentional dictation errors.   Loleta RoseForbach, Primitivo Merkey, MD 07/20/18 [phone]

## 2018-07-20 NOTE — ED Triage Notes (Addendum)
Pt states was seen at urgent care today for sore throat. Pt states she "can't swallow" and has a fever. Pt without drooling, able to speak in clear sentences, tonsils are red in triage. Pt was started on antibiotics, but states she cannot "swallow the pill". Pt states strep screen at urgent care was negative.

## 2018-07-20 NOTE — Discharge Instructions (Signed)
As we discussed, although you have an infection in your throat and tonsils, there is no sign of an abscess or drainable fluid collection.  We gave you a one-time dose of steroids (Decadron 10 mg IV) as well as a dose of broad-spectrum antibiotics (ceftriaxone 2 g IV).  Please continue taking the Augmentin that was previously prescribed for the full course of treatment.  Do not stop taking it early.  Use over-the-counter ibuprofen and Tylenol as needed for pain control and please be sure to drink plenty of fluids to stay hydrated even if you do not feel like eating much food.  Follow-up with your ENT provider as needed next week and  return to the emergency department if you develop new or worsening symptoms that concern you.

## 2018-07-28 ENCOUNTER — Encounter: Payer: Self-pay | Admitting: Obstetrics and Gynecology

## 2018-08-04 ENCOUNTER — Encounter: Payer: Self-pay | Admitting: *Deleted

## 2018-08-04 ENCOUNTER — Other Ambulatory Visit: Payer: Self-pay

## 2018-08-12 ENCOUNTER — Ambulatory Visit: Payer: 59 | Admitting: Anesthesiology

## 2018-08-12 ENCOUNTER — Ambulatory Visit
Admission: RE | Admit: 2018-08-12 | Discharge: 2018-08-12 | Disposition: A | Discharge status: Home / Self Care | Payer: 59 | Attending: Otolaryngology | Admitting: Otolaryngology

## 2018-08-12 ENCOUNTER — Encounter
Admission: RE | Disposition: A | Discharge status: Home / Self Care | Payer: Self-pay | Source: Home / Self Care | Attending: Otolaryngology

## 2018-08-12 DIAGNOSIS — F17210 Nicotine dependence, cigarettes, uncomplicated: Secondary | ICD-10-CM | POA: Insufficient documentation

## 2018-08-12 DIAGNOSIS — J3501 Chronic tonsillitis: Secondary | ICD-10-CM | POA: Insufficient documentation

## 2018-08-12 HISTORY — PX: TONSILLECTOMY: SHX5217

## 2018-08-12 HISTORY — DX: Dizziness and giddiness: R42

## 2018-08-12 LAB — POCT PREGNANCY, URINE: Preg Test, Ur: NEGATIVE

## 2018-08-12 SURGERY — TONSILLECTOMY
Anesthesia: General | Site: Throat | Laterality: Bilateral

## 2018-08-12 MED ORDER — LIDOCAINE HCL (CARDIAC) PF 100 MG/5ML IV SOSY
PREFILLED_SYRINGE | INTRAVENOUS | Status: DC | PRN
  Administered 2018-08-12: 40 mg via INTRAVENOUS

## 2018-08-12 MED ORDER — DEXAMETHASONE SODIUM PHOSPHATE 4 MG/ML IJ SOLN
INTRAMUSCULAR | Status: DC | PRN
  Administered 2018-08-12: 10 mg via INTRAVENOUS

## 2018-08-12 MED ORDER — PREDNISOLONE SODIUM PHOSPHATE 15 MG/5ML PO SOLN: ORAL | 0 refills | Status: DC

## 2018-08-12 MED ORDER — ACETAMINOPHEN 10 MG/ML IV SOLN
1000.0000 mg | Freq: Once | INTRAVENOUS | Status: AC
  Administered 2018-08-12: 1000 mg via INTRAVENOUS

## 2018-08-12 MED ORDER — FENTANYL CITRATE (PF) 100 MCG/2ML IJ SOLN
INTRAMUSCULAR | Status: DC | PRN
  Administered 2018-08-12 (×2): 12.5 ug via INTRAVENOUS
  Administered 2018-08-12: 25 ug via INTRAVENOUS
  Administered 2018-08-12: 12.5 ug via INTRAVENOUS
  Administered 2018-08-12: 25 ug via INTRAVENOUS
  Administered 2018-08-12: 12.5 ug via INTRAVENOUS
  Administered 2018-08-12: 100 ug via INTRAVENOUS

## 2018-08-12 MED ORDER — FENTANYL CITRATE (PF) 100 MCG/2ML IJ SOLN
25.0000 ug | INTRAMUSCULAR | Status: DC | PRN
  Administered 2018-08-12 (×2): 25 ug via INTRAVENOUS

## 2018-08-12 MED ORDER — PROPOFOL 10 MG/ML IV BOLUS
INTRAVENOUS | Status: DC | PRN
  Administered 2018-08-12: 150 mg via INTRAVENOUS

## 2018-08-12 MED ORDER — ONDANSETRON HCL 4 MG/2ML IJ SOLN
INTRAMUSCULAR | Status: DC | PRN
  Administered 2018-08-12: 4 mg via INTRAVENOUS

## 2018-08-12 MED ORDER — OXYCODONE HCL 5 MG/5ML PO SOLN
5.0000 mg | Freq: Once | ORAL | Status: AC | PRN
  Administered 2018-08-12: 5 mg via ORAL

## 2018-08-12 MED ORDER — GLYCOPYRROLATE 0.2 MG/ML IJ SOLN
INTRAMUSCULAR | Status: DC | PRN
  Administered 2018-08-12: .1 mg via INTRAVENOUS

## 2018-08-12 MED ORDER — BUPIVACAINE-EPINEPHRINE 0.25% -1:200000 IJ SOLN
INTRAMUSCULAR | Status: DC | PRN
  Administered 2018-08-12: 2.5 mL

## 2018-08-12 MED ORDER — HYDROCODONE-ACETAMINOPHEN 7.5-325 MG/15ML PO SOLN: ORAL | 0 refills | Status: DC

## 2018-08-12 MED ORDER — SCOPOLAMINE 1 MG/3DAYS TD PT72
1.0000 | MEDICATED_PATCH | TRANSDERMAL | Status: DC
  Administered 2018-08-12: 1.5 mg via TRANSDERMAL

## 2018-08-12 MED ORDER — OXYCODONE HCL 5 MG PO TABS: 5.0000 mg | ORAL_TABLET | Freq: Once | ORAL | Status: AC | PRN

## 2018-08-12 MED ORDER — OXYMETAZOLINE HCL 0.05 % NA SOLN
NASAL | Status: DC | PRN
  Administered 2018-08-12: 1 via TOPICAL

## 2018-08-12 MED ORDER — LACTATED RINGERS IV SOLN
INTRAVENOUS | Status: DC
  Administered 2018-08-12: 07:00:00 via INTRAVENOUS

## 2018-08-12 MED ORDER — MIDAZOLAM HCL 5 MG/5ML IJ SOLN
INTRAMUSCULAR | Status: DC | PRN
  Administered 2018-08-12: 2 mg via INTRAVENOUS

## 2018-08-12 MED ORDER — SUCCINYLCHOLINE CHLORIDE 20 MG/ML IJ SOLN
INTRAMUSCULAR | Status: DC | PRN
  Administered 2018-08-12: 80 mg via INTRAVENOUS

## 2018-08-12 SURGICAL SUPPLY — 17 items
BLADE BOVIE TIP EXT 4 (BLADE) ×3 IMPLANT
CANISTER SUCT 1200ML W/VALVE (MISCELLANEOUS) ×3 IMPLANT
COAG SUCT 10F 3.5MM HAND CTRL (MISCELLANEOUS) ×3 IMPLANT
DECANTER SPIKE VIAL GLASS SM (MISCELLANEOUS) ×3 IMPLANT
ELECT REM PT RETURN 9FT ADLT (ELECTROSURGICAL) ×3
ELECTRODE REM PT RTRN 9FT ADLT (ELECTROSURGICAL) ×1 IMPLANT
GLOVE PI ULTRA LF STRL 7.5 (GLOVE) IMPLANT
GLOVE PI ULTRA NON LATEX 7.5 (GLOVE) ×2
KIT TURNOVER KIT A (KITS) ×3 IMPLANT
NS IRRIG 500ML POUR BTL (IV SOLUTION) ×3 IMPLANT
PACK TONSIL AND ADENOID CUSTOM (PACKS) ×3 IMPLANT
PENCIL SMOKE EVACUATOR (MISCELLANEOUS) ×3 IMPLANT
SLEEVE SUCTION 125 (MISCELLANEOUS) ×3 IMPLANT
SOL ANTI-FOG 6CC FOG-OUT (MISCELLANEOUS) ×1 IMPLANT
SOL FOG-OUT ANTI-FOG 6CC (MISCELLANEOUS) ×2
SUCTION FRAZIER HANDLE 10FR (MISCELLANEOUS) ×2
SUCTION TUBE FRAZIER 10FR DISP (MISCELLANEOUS) IMPLANT

## 2018-08-12 NOTE — Anesthesia Postprocedure Evaluation (Signed)
Anesthesia Post Note  Patient: [name] [name]  Procedure(s) Performed: TONSILLECTOMY (Bilateral Throat)  Patient location during evaluation: PACU Anesthesia Type: General Level of consciousness: awake and alert Pain management: pain level controlled Vital Signs Assessment: post-procedure vital signs reviewed and stable Respiratory status: spontaneous breathing, nonlabored ventilation, respiratory function stable and patient connected to nasal cannula oxygen Cardiovascular status: blood pressure returned to baseline and stable Postop Assessment: no apparent nausea or vomiting Anesthetic complications: no    Dauntae Derusha

## 2018-08-12 NOTE — H&P (Signed)
History and physical reviewed and will be scanned in later. No change in medical status reported by the patient or family, appears stable for surgery. All questions regarding the procedure answered, and patient (or family if a child) expressed understanding of the procedure. ? ?[name] [name] S [name] [name] ?@TODAY@ ?

## 2018-08-12 NOTE — Op Note (Signed)
08/12/2018  9:30 AM    [name] [name]  [id number]   Pre-Op Diagnosis:  CHRONIC TONSILLITIS  Post-op Diagnosis: CHRONIC TONSILLITIS  Procedure: Tonsillectomy  Surgeon:  Sandi Mealy., MD  Anesthesia:  General endotracheal  EBL:  Less than 25 cc  Complications:  None  Findings: 3+ cryptic tonsils, chronically inflamed  Procedure: The patient was taken to the Operating Room and placed in the supine position.  After induction of general endotracheal anesthesia, the table was turned 90 degrees and the patient was draped in the usual fashion  with the eyes protected.  A mouth gag was inserted into the oral cavity to open the mouth, and examination of the oropharynx showed the uvula was non-bifid. The palate was palpated, and there was no evidence of submucous cleft. Examination of the nasopharynx showed no obstructing adenoids. The right tonsil was grasped with an Allis clamp and resected from the tonsillar fossa in the usual fashion with the Bovie. The left tonsil was resected in the same fashion. The Bovie was used to obtain hemostasis. Each tonsillar fossa was then carefully injected with 0.25% marcaine with epinephrine, 1:200,000, avoiding intravascular injection. The nose and throat were irrigated and suctioned to remove any  blood clot. The mouth gag was  removed with no evidence of active bleeding.  The patient was then returned to the anesthesiologist for awakening, and was taken to the Recovery Room in stable condition.  Cultures:  None.  Specimens:  Tonsils.  Disposition:   PACU to home  Plan: Soft, bland diet and push fluids. Take pain medications and prednisone as prescribed. No strenuous activity for 2 weeks. Follow-up in 3 weeks.  Sandi Mealy 08/12/2018 9:30 AM

## 2018-08-12 NOTE — Transfer of Care (Signed)
Immediate Anesthesia Transfer of Care Note  Patient: [name] [name]  Procedure(s) Performed: TONSILLECTOMY (Bilateral Throat)  Patient Location: PACU  Anesthesia Type: General ETT  Level of Consciousness: awake, alert  and patient cooperative  Airway and Oxygen Therapy: Patient Spontanous Breathing and Patient connected to supplemental oxygen  Post-op Assessment: Post-op Vital signs reviewed, Patient's Cardiovascular Status Stable, Respiratory Function Stable, Patent Airway and No signs of Nausea or vomiting  Post-op Vital Signs: Reviewed and stable  Complications: No apparent anesthesia complications

## 2018-08-12 NOTE — Discharge Instructions (Signed)
T & A INSTRUCTION SHEET - [facility] °[facility] ° °CREIGHTON VAUGHT, MD °PAUL H. JUENGEL, MD  °P. SCOTT BENNETT °CHAPMAN MCQUEEN, MD ° °[address] [place], [place] [address] TEL. [phone] °[address] [place]  [address] [phone] ° °INFORMATION SHEET FOR A TONSILLECTOMY AND ADENDOIDECTOMY ° °About Your Tonsils and Adenoids ° The tonsils and adenoids are normal body tissues that are part of our immune system.  They normally help to protect us against diseases that may enter our mouth and nose.  However, sometimes the tonsils and/or adenoids become too large and obstruct our breathing, especially at night. °  ° If either of these things happen it helps to remove the tonsils and adenoids in order to become healthier. The operation to remove the tonsils and adenoids is called a tonsillectomy and adenoidectomy. ° °The Location of Your Tonsils and Adenoids ° The tonsils are located in the back of the throat on both side and sit in a cradle of muscles. The adenoids are located in the roof of the mouth, behind the nose, and closely associated with the opening of the Eustachian tube to the ear. ° °Surgery on Tonsils and Adenoids ° A tonsillectomy and adenoidectomy is a short operation which takes about thirty minutes.  This includes being put to sleep and being awakened.  Tonsillectomies and adenoidectomies are performed at [facility] and may require observation period in the recovery room prior to going home. ° °Following the Operation for a Tonsillectomy ° A cautery machine is used to control bleeding.  Bleeding from a tonsillectomy and adenoidectomy is minimal and postoperatively the risk of bleeding is approximately four percent, although this rarely life threatening. ° °After your tonsillectomy and adenoidectomy post-op care at home: ° °1. Our patients are able to go home the same day.  You may be given prescriptions for pain  medications and antibiotics, if indicated. °2. It is extremely important to remember that fluid intake is of utmost importance after a tonsillectomy.  The amount that you drink must be maintained in the postoperative period.  A good indication of whether a child is getting enough fluid is whether his/her urine output is constant.  As long as children are urinating or wetting their diaper every 6 - 8 hours this is usually enough fluid intake.   °3. Although rare, this is a risk of some bleeding in the first ten days after surgery.  This is usually occurs between day five and nine postoperatively.  This risk of bleeding is approximately four percent.  If you or your child should have any bleeding you should remain calm and notify our office or go directly to the Emergency Room at [facility] where they will contact us. Our doctors are available seven days a week for notification.  We recommend sitting up quietly in a chair, place an ice pack on the front of the neck and spitting out the blood gently until we are able to contact you.  Adults should gargle gently with ice water and this may help stop the bleeding.  If the bleeding does not stop after a short time, i.e. 10 to 15 minutes, or seems to be increasing again, please contact us or go to the hospital.   °4. It is common for the pain to be worse at 5 - 7 days postoperatively.  This occurs because the “scab” is peeling off and the mucous membrane (skin of the throat)   is growing back where the tonsils were.   5. It is common for a low-grade fever, less than 102, during the first week after a tonsillectomy and adenoidectomy.  It is usually due to not drinking enough liquids, and we suggest your use liquid Tylenol or the pain medicine with Tylenol prescribed in order to keep your temperature below 102.  Please follow the directions on the back of the bottle. 6. Do not take aspirin or any products that contain aspirin such as Bufferin, Anacin,  Ecotrin, aspirin gum, Goodies, BC headache powders, etc., after a T&A because it can promote bleeding.  Please check with our office before administering any other medication that may been prescribed by other doctors during the two week post-operative period. 7. If you happen to look in the mirror or into your childs mouth you will see white/gray patches on the back of the throat.  This is what a scab looks like in the mouth and is normal after having a T&A.  It will disappear once the tonsil area heals completely. However, it may cause a noticeable odor, and this too will disappear with time.     8. You or your child may experience ear pain after having a T&A.  This is called referred pain and comes from the throat, but it is felt in the ears.  Ear pain is quite common and expected.  It will usually go away after ten days.  There is usually nothing wrong with the ears, and it is primarily due to the healing area stimulating the nerve to the ear that runs along the side of the throat.  Use either the prescribed pain medicine or Tylenol as needed.  9. The throat tissues after a tonsillectomy are obviously sensitive.  Smoking around children who have had a tonsillectomy significantly increases the risk of bleeding.  DO NOT SMOKE!   Scopolamine skin patches REMOVE PATCH IN 72 HOURS AND WASH HANDS IMMEDIATELY  What is this medicine? SCOPOLAMINE (skoe POL a meen) is used to prevent nausea and vomiting caused by motion sickness, anesthesia and surgery. This medicine may be used for other purposes; ask your health care provider or pharmacist if you have questions. COMMON BRAND NAME(S): Transderm Scop What should I tell my health care provider before I take this medicine? They need to know if you have any of these conditions: -are scheduled to have a gastric secretion test -glaucoma -heart disease -kidney disease -liver disease -lung or breathing disease, like asthma -mental illness -prostate  disease -seizures -stomach or intestine problems -trouble passing urine -an unusual or allergic reaction to scopolamine, atropine, other medicines, foods, dyes, or preservatives -pregnant or trying to get pregnant -breast-feeding How should I use this medicine? This medicine is for external use only. Follow the directions on the prescription label. Wear only 1 patch at a time. Choose an area behind the ear, that is clean, dry, hairless and free from any cuts or irritation. Wipe the area with a clean dry tissue. Peel off the plastic backing of the skin patch, trying not to touch the adhesive side with your hands. Do not cut the patches. Firmly apply to the area you have chosen, with the metallic side of the patch to the skin and the tan-colored side showing. Once firmly in place, wash your hands well with soap and water. Do not get this medicine into your eyes. After removing the patch, wash your hands and the area behind your ear thoroughly with soap and water. The  patch will still contain some medicine after use. To avoid accidental contact or ingestion by children or pets, fold the used patch in half with the sticky side together and throw away in the trash out of the reach of children and pets. If you need to use a second patch after you remove the first, place it behind the other ear. A special MedGuide will be given to you by the pharmacist with each prescription and refill. Be sure to read this information carefully each time. Talk to your pediatrician regarding the use of this medicine in children. Special care may be needed. Overdosage: If you think you have taken too much of this medicine contact a poison control center or emergency room at once. NOTE: This medicine is only for you. Do not share this medicine with others. What if I miss a dose? This does not apply. This medicine is not for regular use. What may interact with this medicine? -alcohol -antihistamines for allergy cough and  cold -atropine -certain medicines for anxiety or sleep -certain medicines for bladder problems like oxybutynin, tolterodine -certain medicines for depression like amitriptyline, fluoxetine, sertraline -certain medicines for stomach problems like dicyclomine, hyoscyamine -certain medicines for Parkinson's disease like benztropine, trihexyphenidyl -certain medicines for seizures like phenobarbital, primidone -general anesthetics like halothane, isoflurane, methoxyflurane, propofol -ipratropium -local anesthetics like lidocaine, pramoxine, tetracaine -medicines that relax muscles for surgery -phenothiazines like chlorpromazine, mesoridazine, prochlorperazine, thioridazine -narcotic medicines for pain -other belladonna alkaloids This list may not describe all possible interactions. Give your health care provider a list of all the medicines, herbs, non-prescription drugs, or dietary supplements you use. Also tell them if you smoke, drink alcohol, or use illegal drugs. Some items may interact with your medicine. What should I watch for while using this medicine? Limit contact with water while swimming and bathing because the patch may fall off. If the patch falls off, throw it away and put a new one behind the other ear. You may get drowsy or dizzy. Do not drive, use machinery, or do anything that needs mental alertness until you know how this medicine affects you. Do not stand or sit up quickly, especially if you are an older patient. This reduces the risk of dizzy or fainting spells. Alcohol may interfere with the effect of this medicine. Avoid alcoholic drinks. Your mouth may get dry. Chewing sugarless gum or sucking hard candy, and drinking plenty of water may help. Contact your healthcare professional if the problem does not go away or is severe. This medicine may cause dry eyes and blurred vision. If you wear contact lenses, you may feel some discomfort. Lubricating drops may help. See your  healthcare professional if the problem does not go away or is severe. If you are going to need surgery, an MRI, CT scan, or other procedure, tell your healthcare professional that you are using this medicine. You may need to remove the patch before the procedure. What side effects may I notice from receiving this medicine? Side effects that you should report to your doctor or health care professional as soon as possible: -allergic reactions like skin rash, itching or hives; swelling of the face, lips, or tongue -blurred vision -changes in vision -confusion -dizziness -eye pain -fast, irregular heartbeat -hallucinations, loss of contact with reality -nausea, vomiting -pain or trouble passing urine -restlessness -seizures -skin irritation -stomach pain Side effects that usually do not require medical attention (report to your doctor or health care professional if they continue or are bothersome): -drowsiness -dry  dry mouth °-headache °-sore throat °This list may not describe all possible side effects. Call your doctor for medical advice about side effects. You may report side effects to FDA at 1-800-FDA-1088. °Where should I keep my medicine? °Keep out of the reach of children. °Store at room temperature between 20 and 25 degrees C (68 and 77 degrees F). Keep this medicine in the foil package until ready to use. Throw away any unused medicine after the expiration date. °NOTE: This sheet is a summary. It may not cover all possible information. If you have questions about this medicine, talk to your doctor, pharmacist, or health care provider. °© 2019 Elsevier/Gold Standard (2017-08-16 16:14:46) ° ° °General Anesthesia, Adult, Care After °This sheet gives you information about how to care for yourself after your procedure. Your health care provider may also give you more specific instructions. If you have problems or questions, contact your health care provider. °What can I expect after the  procedure? °After the procedure, the following side effects are common: °· Pain or discomfort at the IV site. °· Nausea. °· Vomiting. °· Sore throat. °· Trouble concentrating. °· Feeling cold or chills. °· Weak or tired. °· Sleepiness and fatigue. °· Soreness and body aches. These side effects can affect parts of the body that were not involved in surgery. °Follow these instructions at home: ° °For at least 24 hours after the procedure: °· Have a responsible adult stay with you. It is important to have someone help care for you until you are awake and alert. °· Rest as needed. °· Do not: °? Participate in activities in which you could fall or become injured. °? Drive. °? Use heavy machinery. °? Drink alcohol. °? Take sleeping pills or medicines that cause drowsiness. °? Make important decisions or sign legal documents. °? Take care of children on your own. °Eating and drinking °· Follow any instructions from your health care provider about eating or drinking restrictions. °· When you feel hungry, start by eating small amounts of foods that are soft and easy to digest (bland), such as toast. Gradually return to your regular diet. °· Drink enough fluid to keep your urine pale yellow. °· If you vomit, rehydrate by drinking water, juice, or clear broth. °General instructions °· If you have sleep apnea, surgery and certain medicines can increase your risk for breathing problems. Follow instructions from your health care provider about wearing your sleep device: °? Anytime you are sleeping, including during daytime naps. °? While taking prescription pain medicines, sleeping medicines, or medicines that make you drowsy. °· Return to your normal activities as told by your health care provider. Ask your health care provider what activities are safe for you. °· Take over-the-counter and prescription medicines only as told by your health care provider. °· If you smoke, do not smoke without supervision. °· Keep all follow-up  visits as told by your health care provider. This is important. °Contact a health care provider if: °· You have nausea or vomiting that does not get better with medicine. °· You cannot eat or drink without vomiting. °· You have pain that does not get better with medicine. °· You are unable to pass urine. °· You develop a skin rash. °· You have a fever. °· You have redness around your IV site that gets worse. °Get help right away if: °· You have difficulty breathing. °· You have chest pain. °· You have blood in your urine or stool, or you vomit blood. °Summary °· After   procedure, it is common to have a sore throat or nausea. It is also common to feel tired.  Have a responsible adult stay with you for the first 24 hours after general anesthesia. It is important to have someone help care for you until you are awake and alert.  When you feel hungry, start by eating small amounts of foods that are soft and easy to digest (bland), such as toast. Gradually return to your regular diet.  Drink enough fluid to keep your urine pale yellow.  Return to your normal activities as told by your health care provider. Ask your health care provider what activities are safe for you. This information is not intended to replace advice given to you by your health care provider. Make sure you discuss any questions you have with your health care provider. Document Released: 09/03/2000 Document Revised: 01/11/2017 Document Reviewed: 01/11/2017 Elsevier Interactive Patient Education  2019 [employer].

## 2018-08-12 NOTE — Anesthesia Preprocedure Evaluation (Signed)
Anesthesia Evaluation  Patient identified by MRN, date of birth, ID band  Reviewed: NPO status   History of Anesthesia Complications Negative for: history of anesthetic complications  Airway Mallampati: II  TM Distance: >3 FB Neck ROM: full    Dental no notable dental hx.    Pulmonary Current Smoker,    Pulmonary exam normal        Cardiovascular Exercise Tolerance: Good negative cardio ROS Normal cardiovascular exam     Neuro/Psych  Headaches, negative psych ROS   GI/Hepatic Neg liver ROS, GERD  Controlled,  Endo/Other  negative endocrine ROS  Renal/GU negative Renal ROS  negative genitourinary   Musculoskeletal   Abdominal   Peds  Hematology negative hematology ROS (+)   Anesthesia Other Findings Med stable : jan 2020;  Reproductive/Obstetrics negative OB ROS                             Anesthesia Physical Anesthesia Plan  ASA: II  Anesthesia Plan: General ETT   Post-op Pain Management:    Induction:   PONV Risk Score and Plan:   Airway Management Planned:   Additional Equipment:   Intra-op Plan:   Post-operative Plan:   Informed Consent: I have reviewed the patients History and Physical, chart, labs and discussed the procedure including the risks, benefits and alternatives for the proposed anesthesia with the patient or authorized representative who has indicated his/her understanding and acceptance.       Plan Discussed with: CRNA  Anesthesia Plan Comments:         Anesthesia Quick Evaluation

## 2018-08-12 NOTE — Anesthesia Procedure Notes (Signed)
Procedure Name: Intubation Date/Time: 08/12/2018 8:38 AM Performed by: Jimmy Picket, CRNA Pre-anesthesia Checklist: Patient identified, Emergency Drugs available, Suction available, Patient being monitored and Timeout performed Patient Re-evaluated:Patient Re-evaluated prior to induction Oxygen Delivery Method: Circle system utilized Preoxygenation: Pre-oxygenation with 100% oxygen Induction Type: IV induction Ventilation: Mask ventilation without difficulty Laryngoscope Size: Miller and 2 Grade View: Grade I Tube type: Oral Rae Tube size: 7.0 mm Number of attempts: 1 Placement Confirmation: ETT inserted through vocal cords under direct vision,  positive ETCO2 and breath sounds checked- equal and bilateral Tube secured with: Tape Dental Injury: Teeth and Oropharynx as per pre-operative assessment

## 2018-08-13 ENCOUNTER — Encounter: Payer: Self-pay | Admitting: Otolaryngology

## 2018-08-14 LAB — SURGICAL PATHOLOGY

## 2019-01-15 ENCOUNTER — Ambulatory Visit
Admission: RE | Admit: 2019-01-15 | Discharge: 2019-01-15 | Disposition: A | Discharge status: Home / Self Care | Payer: 59 | Source: Ambulatory Visit | Attending: Obstetrics and Gynecology | Admitting: Obstetrics and Gynecology

## 2019-01-15 DIAGNOSIS — N6320 Unspecified lump in the left breast, unspecified quadrant: Secondary | ICD-10-CM | POA: Diagnosis not present

## 2019-02-18 ENCOUNTER — Other Ambulatory Visit: Payer: Self-pay | Admitting: Obstetrics and Gynecology

## 2019-02-18 DIAGNOSIS — N6320 Unspecified lump in the left breast, unspecified quadrant: Secondary | ICD-10-CM

## 2019-02-24 ENCOUNTER — Telehealth: Payer: Self-pay | Admitting: Obstetrics and Gynecology

## 2019-02-24 NOTE — Telephone Encounter (Signed)
Left message on patient's voicemail about her appointment with [facility]. Patient is scheduled for Monday February 8 @ 1:20pm. Let patient know if that time and date wouldn't work for her to call [place] at [phone].

## 2019-02-25 NOTE — Telephone Encounter (Signed)
Patient aware of appointment time and date scheduled at [facility].

## 2019-06-17 ENCOUNTER — Other Ambulatory Visit: Payer: Self-pay

## 2019-06-17 ENCOUNTER — Encounter: Payer: Self-pay | Admitting: Emergency Medicine

## 2019-06-17 ENCOUNTER — Emergency Department
Admission: EM | Admit: 2019-06-17 | Discharge: 2019-06-17 | Disposition: A | Discharge status: Home / Self Care | Payer: 59 | Attending: Emergency Medicine | Admitting: Emergency Medicine

## 2019-06-17 ENCOUNTER — Emergency Department: Payer: 59

## 2019-06-17 DIAGNOSIS — N200 Calculus of kidney: Secondary | ICD-10-CM | POA: Diagnosis not present

## 2019-06-17 DIAGNOSIS — N3000 Acute cystitis without hematuria: Secondary | ICD-10-CM | POA: Diagnosis not present

## 2019-06-17 DIAGNOSIS — F17210 Nicotine dependence, cigarettes, uncomplicated: Secondary | ICD-10-CM | POA: Diagnosis not present

## 2019-06-17 DIAGNOSIS — Z91040 Latex allergy status: Secondary | ICD-10-CM | POA: Insufficient documentation

## 2019-06-17 DIAGNOSIS — R109 Unspecified abdominal pain: Secondary | ICD-10-CM | POA: Diagnosis present

## 2019-06-17 LAB — BASIC METABOLIC PANEL
Anion gap: 10 (ref 5–15)
BUN: 17 mg/dL (ref 6–20)
CO2: 26 mmol/L (ref 22–32)
Calcium: 9.4 mg/dL (ref 8.9–10.3)
Chloride: 103 mmol/L (ref 98–111)
Creatinine, Ser: 0.75 mg/dL (ref 0.44–1.00)
GFR calc Af Amer: >60 mL/min (ref >60)
GFR calc non Af Amer: >60 mL/min (ref >60)
Glucose, Bld: 136 mg/dL — ABNORMAL HIGH (ref 70–99)
Potassium: 4.3 mmol/L (ref 3.5–5.1)
Sodium: 139 mmol/L (ref 135–145)

## 2019-06-17 LAB — CBC
HCT: 38 % (ref 36.0–46.0)
Hemoglobin: 12.4 g/dL (ref 12.0–15.0)
MCH: 28.7 pg (ref 26.0–34.0)
MCHC: 32.6 g/dL (ref 30.0–36.0)
MCV: 88 fL (ref 80.0–100.0)
Platelets: 410 10*3/uL — ABNORMAL HIGH (ref 150–400)
RBC: 4.32 MIL/uL (ref 3.87–5.11)
RDW: 15.2 % (ref 11.5–15.5)
WBC: 17.9 10*3/uL — ABNORMAL HIGH (ref 4.0–10.5)
nRBC: 0 % (ref 0.0–0.2)

## 2019-06-17 LAB — URINALYSIS, COMPLETE (UACMP) WITH MICROSCOPIC
Bilirubin Urine: NEGATIVE
Glucose, UA: NEGATIVE mg/dL
Hgb urine dipstick: NEGATIVE
Ketones, ur: NEGATIVE mg/dL
Nitrite: NEGATIVE
Protein, ur: NEGATIVE mg/dL
Specific Gravity, Urine: 1.015 (ref 1.005–1.030)
pH: 7 (ref 5.0–8.0)

## 2019-06-17 LAB — POCT PREGNANCY, URINE: Preg Test, Ur: NEGATIVE

## 2019-06-17 MED ORDER — CIPROFLOXACIN HCL 500 MG PO TABS: 500.0000 mg | ORAL_TABLET | Freq: Two times a day (BID) | ORAL | 0 refills | Status: AC

## 2019-06-17 MED ORDER — KETOROLAC TROMETHAMINE 30 MG/ML IJ SOLN
30.0000 mg | Freq: Once | INTRAMUSCULAR | Status: AC
  Administered 2019-06-17: 30 mg via INTRAVENOUS
  Filled 2019-06-17: qty 1

## 2019-06-17 MED ORDER — KETOROLAC TROMETHAMINE 10 MG PO TABS: 10.0000 mg | ORAL_TABLET | Freq: Four times a day (QID) | ORAL | 0 refills | Status: DC | PRN

## 2019-06-17 MED ORDER — SODIUM CHLORIDE 0.9 % IV SOLN
1.0000 g | Freq: Once | INTRAVENOUS | Status: AC
  Administered 2019-06-17: 10:00:00 1 g via INTRAVENOUS
  Filled 2019-06-17: qty 10

## 2019-06-17 NOTE — ED Notes (Signed)
RN went to assess pt at this time, pt not in room, may be at CT

## 2019-06-17 NOTE — ED Provider Notes (Signed)
[facility] Emergency Department Provider Note       Time seen: ----------------------------------------- 9:24 AM on 06/17/2019 ----------------------------------------- I have reviewed the triage vital signs and the nursing notes.  HISTORY   Chief Complaint Flank Pain, Emesis, and Nausea   HPI [name] [name] is a 34 y.o. female with a history of migraines who presents to the ED for sudden onset of pain in the left flank that now started moving down some.  She reports pain is enough to make her nauseous and throw up.  She reports her doctor started her on antibiotics Monday for UTI due to blood in her urine.  Discomfort is 6 out of 10 and sharp in the left flank.  Past Medical History:  Diagnosis Date  . Family history of breast cancer   . Gestational diabetes   . History of peritonsillar abscess   . Migraines    none in 5 yrs  . Reflux   . Vertigo    while pregnant    Patient Active Problem List   Diagnosis Date Noted  . Acute appendicitis with localized peritonitis 03/30/2018  . Postpartum care following vaginal delivery 03/25/2017  . Encounter for supervision of low-risk pregnancy, antepartum 10/01/2016    Past Surgical History:  Procedure Laterality Date  . DILATION AND CURETTAGE OF UTERUS    . LAPAROSCOPIC APPENDECTOMY N/A 03/30/2018   Procedure: APPENDECTOMY LAPAROSCOPIC;  Surgeon: Herbert Pun, MD;  Location: [facility] ORS;  Service: General;  Laterality: N/A;  . LAPAROSCOPY    . TONSILLECTOMY Bilateral 08/12/2018   Procedure: TONSILLECTOMY;  Surgeon: Clyde Canterbury, MD;  Location: [place];  Service: ENT;  Laterality: Bilateral;  Latex senstivity Upreg    Allergies Latex, Other, and Sulfa antibiotics  Social History Social History   Tobacco Use  . Smoking status: Current Every Day Smoker    Packs/day: 0.25    Years: 15.00    Pack years: 3.75  . Smokeless tobacco: Never Used  . Tobacco comment: trying to  quit-smokes 1-3 cigarettes/day  Substance Use Topics  . Alcohol use: Yes    Alcohol/week: 8.0 standard drinks    Types: 4 Cans of beer, 4 Shots of liquor per week  . Drug use: No    Review of Systems Constitutional: Negative for fever. Cardiovascular: Negative for chest pain. Respiratory: Negative for shortness of breath. Gastrointestinal: Positive for abdominal pain, vomiting Genitourinary: Positive for hematuria Musculoskeletal: Negative for back pain. Skin: Negative for rash. Neurological: Negative for headaches, focal weakness or numbness.  All systems negative/normal/unremarkable except as stated in the HPI  ____________________________________________   PHYSICAL EXAM:  VITAL SIGNS: ED Triage Vitals  Enc Vitals Group     BP 06/17/19 0836 (!) 111/57     Pulse Rate 06/17/19 0836 90     Resp 06/17/19 0836 20     Temp 06/17/19 0836 99 F (37.2 C)     Temp Source 06/17/19 0836 Oral     SpO2 06/17/19 0836 100 %     Weight 06/17/19 0835 155 lb (70.3 kg)     Height 06/17/19 0835 5\' 4"  (1.626 m)     Head Circumference --      Peak Flow --      Pain Score 06/17/19 0834 6     Pain Loc --      Pain Edu? --      Excl. in [place]? --    Constitutional: Alert and oriented. Well appearing and in no distress. Eyes: Conjunctivae are normal.  Normal extraocular movements. Cardiovascular: Normal rate, regular rhythm. No murmurs, rubs, or gallops. Respiratory: Normal respiratory effort without tachypnea nor retractions. Breath sounds are clear and equal bilaterally. No wheezes/rales/rhonchi. Gastrointestinal: Soft and nontender. Normal bowel sounds Musculoskeletal: Nontender with normal range of motion in extremities. No lower extremity tenderness nor edema. Neurologic:  Normal speech and language. No gross focal neurologic deficits are appreciated.  Skin:  Skin is warm, dry and intact. No rash noted. Psychiatric: Mood and affect are normal. Speech and behavior are normal.   ____________________________________________  ED COURSE:  As part of my medical decision making, I reviewed the following data within the electronic MEDICAL RECORD NUMBER History obtained from family if available, nursing notes, old chart and ekg, as well as notes from prior ED visits. Patient presented for flank pain, we will assess with labs and imaging as indicated at this time.   Procedures  [name] was evaluated in Emergency Department on 06/17/2019 for the symptoms described in the history of present illness. She was evaluated in the context of the global COVID-19 pandemic, which necessitated consideration that the patient might be at risk for infection with the SARS-CoV-2 virus that causes COVID-19. Institutional protocols and algorithms that pertain to the evaluation of patients at risk for COVID-19 are in a state of rapid change based on information released by regulatory bodies including the CDC and federal and state organizations. These policies and algorithms were followed during the patient's care in the ED.  ____________________________________________   LABS (pertinent positives/negatives)  Labs Reviewed  URINALYSIS, COMPLETE (UACMP) WITH MICROSCOPIC - Abnormal; Notable for the following components:      Result Value   Color, Urine YELLOW (*)    APPearance CLEAR (*)    Leukocytes,Ua MODERATE (*)    Bacteria, UA RARE (*)    All other components within normal limits  BASIC METABOLIC PANEL - Abnormal; Notable for the following components:   Glucose, Bld 136 (*)    All other components within normal limits  CBC - Abnormal; Notable for the following components:   WBC 17.9 (*)    Platelets 410 (*)    All other components within normal limits  URINE CULTURE  POCT PREGNANCY, URINE  POC URINE PREG, ED    RADIOLOGY Images were viewed by me  CT renal protocol IMPRESSION:  Punctate bilateral renal stones. No obstruction. No additional  findings to explain the  patient's pain.  ____________________________________________   DIFFERENTIAL DIAGNOSIS   Renal colic, UTI, pyelonephritis, muscle strain  FINAL ASSESSMENT AND PLAN  Flank pain, UTI   Plan: The patient had presented for flank pain and hematuria. Patient's labs revealed significant leukocytosis which may likely be chronic. Patient's imaging revealed punctate bilateral renal stones but no extrarenal stones.  I suspect she may have passed a kidney stone recently, but I am not truly convinced she has pyelonephritis.  Regardless we gave her IV Rocephin and sent a urine culture.  She be discharged with antibiotics and pain medicine.   Ulice Dash, MD    Note: This note was generated in part or whole with voice recognition software. Voice recognition is usually quite accurate but there are transcription errors that can and very often do occur. I apologize for any typographical errors that were not detected and corrected.     Emily Filbert, MD 06/17/19 1049

## 2019-06-17 NOTE — ED Triage Notes (Signed)
Pt reports sudden onset of pain to her upper left flank that has now started moving down some. Pt reports pain is enough to make her nauseated and throw up. Pt reports her MD started her on an abx Monday for a UTI due to blood in her urine.

## 2019-06-18 LAB — URINE CULTURE: Culture: NO GROWTH

## 2019-07-20 ENCOUNTER — Ambulatory Visit
Admission: RE | Admit: 2019-07-20 | Discharge: 2019-07-20 | Disposition: A | Discharge status: Home / Self Care | Payer: 59 | Source: Ambulatory Visit | Attending: Obstetrics and Gynecology | Admitting: Obstetrics and Gynecology

## 2019-07-20 DIAGNOSIS — N6320 Unspecified lump in the left breast, unspecified quadrant: Secondary | ICD-10-CM

## 2019-09-19 ENCOUNTER — Ambulatory Visit: Payer: 59

## 2019-09-19 ENCOUNTER — Ambulatory Visit: Payer: 59 | Attending: Internal Medicine

## 2019-09-19 DIAGNOSIS — Z23 Encounter for immunization: Secondary | ICD-10-CM

## 2019-09-19 NOTE — Progress Notes (Signed)
   Covid-19 Vaccination Clinic  Name:  Monserratt Knezevic    MRN: [id number] DOB: 12-20-1985  09/19/2019  Ms. Darriona Dehaas was observed post Covid-19 immunization for 15 minutes without incident. She was provided with Vaccine Information Sheet and instruction to access the V-Safe system.   Ms. Latiana Tomei was instructed to call 911 with any severe reactions post vaccine: Marland Kitchen Difficulty breathing  . Swelling of face and throat  . A fast heartbeat  . A bad rash all over body  . Dizziness and weakness   Immunizations Administered    Name Date Dose VIS Date Route   Pfizer COVID-19 Vaccine 09/19/2019  8:50 AM 0.3 mL 05/22/2019 Intramuscular   Manufacturer: [employer], [employer]   Lot: [id number]   NDC: [address]-9

## 2019-10-14 ENCOUNTER — Ambulatory Visit: Payer: 59 | Attending: Internal Medicine

## 2019-10-14 DIAGNOSIS — Z23 Encounter for immunization: Secondary | ICD-10-CM

## 2019-10-14 NOTE — Progress Notes (Signed)
   Covid-19 Vaccination Clinic  Name:  [name] [name]    MRN: [id number] DOB: 21-Oct-1985  10/14/2019  [name] was observed post Covid-19 immunization for 15 minutes without incident. She was provided with Vaccine Information Sheet and instruction to access the V-Safe system.   [name] was instructed to call 911 with any severe reactions post vaccine: Marland Kitchen Difficulty breathing  . Swelling of face and throat  . A fast heartbeat  . A bad rash all over body  . Dizziness and weakness   Immunizations Administered    Name Date Dose VIS Date Route   Pfizer COVID-19 Vaccine 10/14/2019  8:49 AM 0.3 mL 08/05/2018 Intramuscular   Manufacturer: [employer], [employer]   Lot: [id number]   NDC: [address]-5

## 2019-12-01 ENCOUNTER — Telehealth: Payer: Self-pay | Admitting: Obstetrics and Gynecology

## 2019-12-01 ENCOUNTER — Other Ambulatory Visit: Payer: Self-pay | Admitting: Obstetrics and Gynecology

## 2019-12-01 DIAGNOSIS — N6320 Unspecified lump in the left breast, unspecified quadrant: Secondary | ICD-10-CM

## 2019-12-01 NOTE — Telephone Encounter (Signed)
Called pt to adv that I scheduled her follow up breast US appt at [facility] - 8/9 at 1:20

## 2019-12-01 NOTE — Telephone Encounter (Signed)
-----   Message from Conard Novak, MD sent at 12/01/2019 11:19 AM EDT ----- Regarding: Follow up left breast mammo/ultrasound at [facility] See orders for follow up diagnostic left mammogram/ultrasound at [facility], per their recommendations. This should be done in August. Thanks

## 2019-12-10 IMAGING — CT CT NECK W/ CM
3 of 5 series · 13 of 33 positions shown, 16 images · IV contrast (iopamidol)
Comparison: None.

CLINICAL DATA: 32 y/o F; sore throat, can swallow. Suspect.
Tonsillar abscess.

EXAM:
CT NECK WITH CONTRAST
TECHNIQUE: Multidetector CT imaging of the neck was performed using the
standard protocol following the bolus administration of intravenous
contrast.
CONTRAST:  75mL HZQ4OF-K99 IOPAMIDOL (HZQ4OF-K99) INJECTION 61%

[Series 6: sag neck · sagittal · 0.39mm/px · 5 of 85 slices shown, 6 images]
[im 29/85  bone]
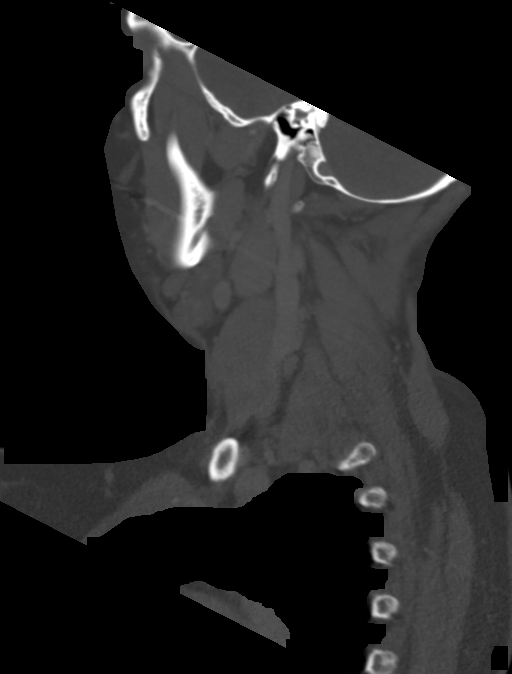
[im 36/85  bone]
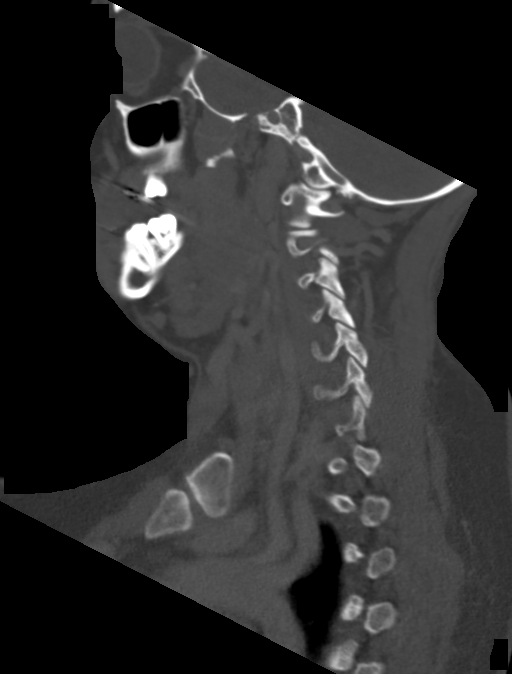
[im 43/85  soft-tissue]
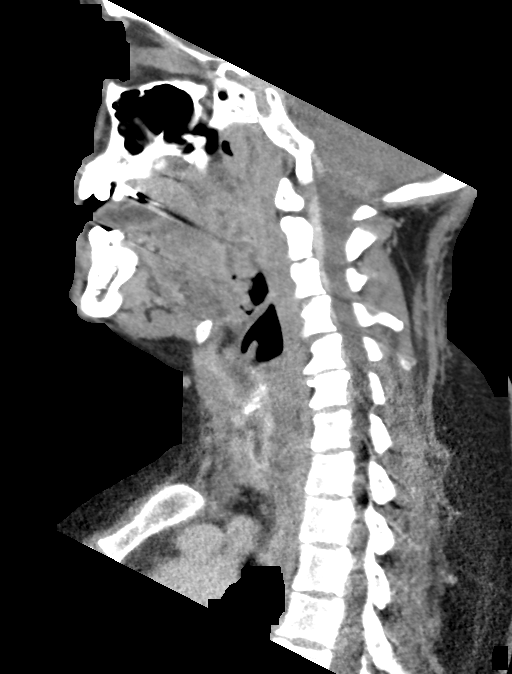
[im 43/85  bone]
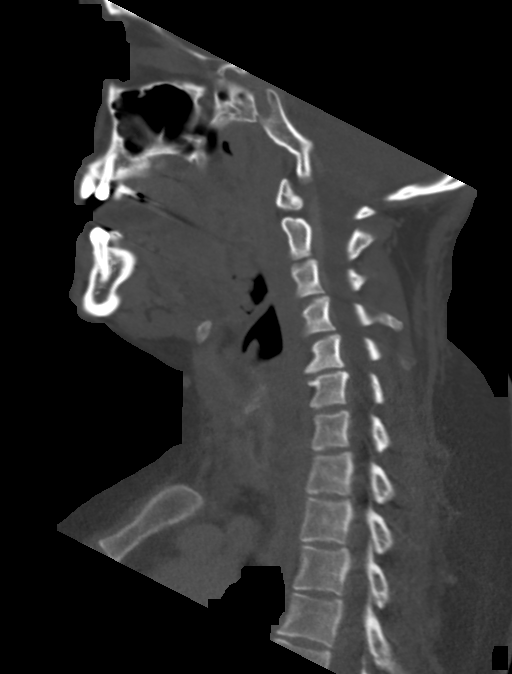
[im 50/85  bone]
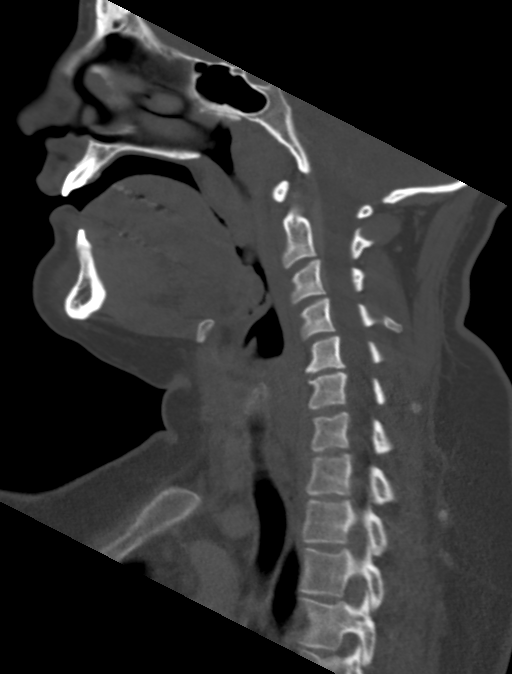
[im 57/85  bone]
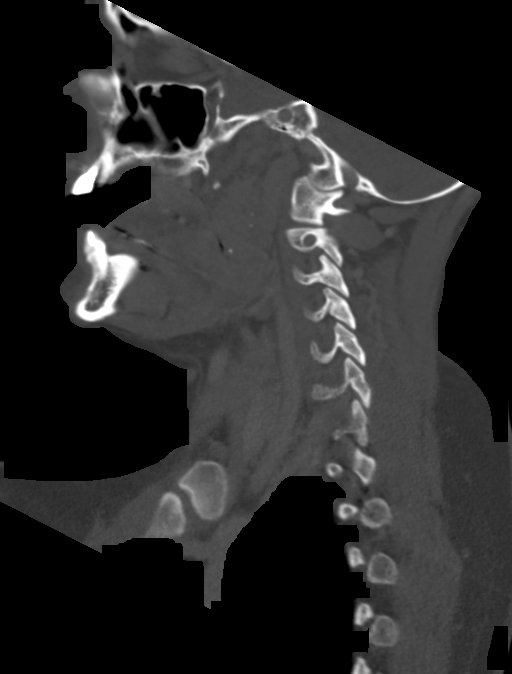

[Series 7: cor neck · coronal · 0.33mm/px · 3 of 84 slices shown]
[im 26/84  bone]
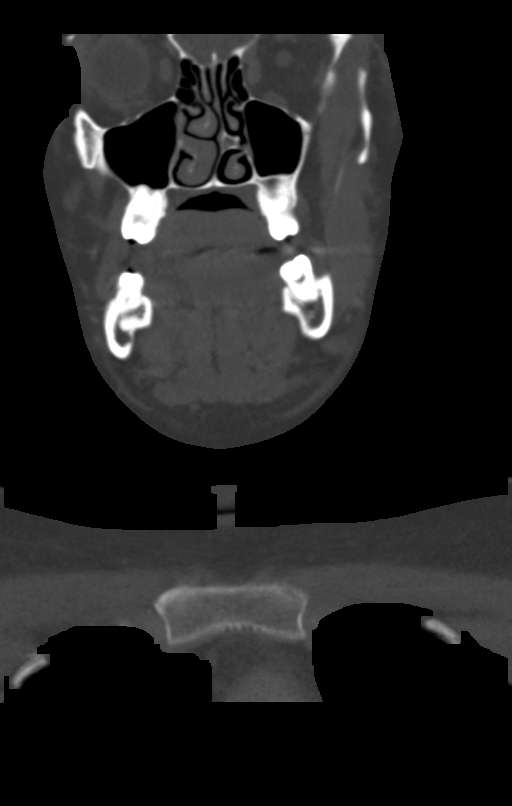
[im 37/84  bone]
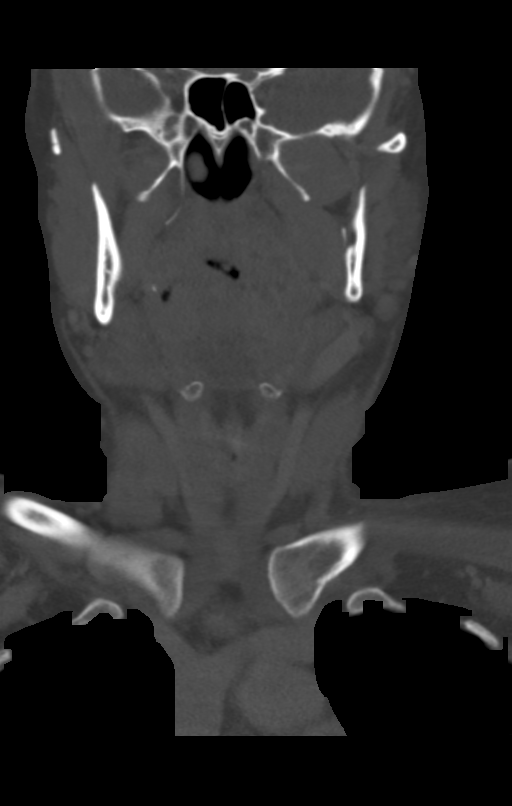
[im 47/84  bone]
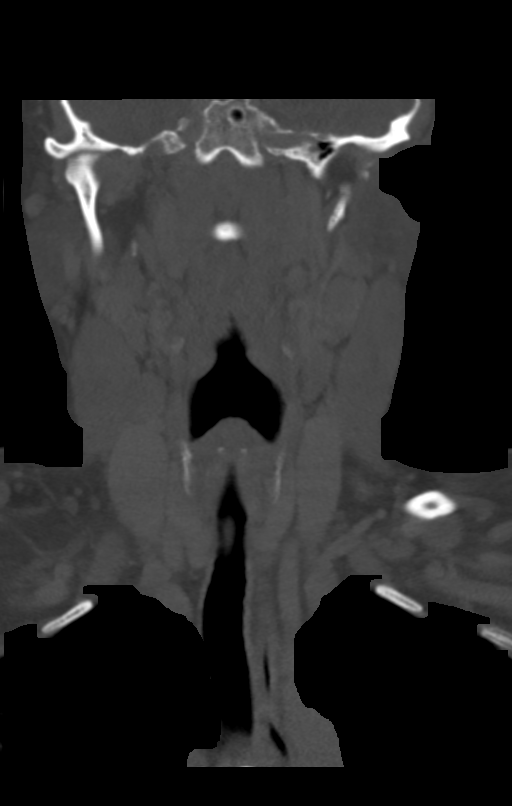

[Series 8: orthogonal ax · axial · 0.33mm/px · z∈[-314,-158]mm · 5 of 134 slices shown, 7 images]
[im 23/134  soft-tissue]
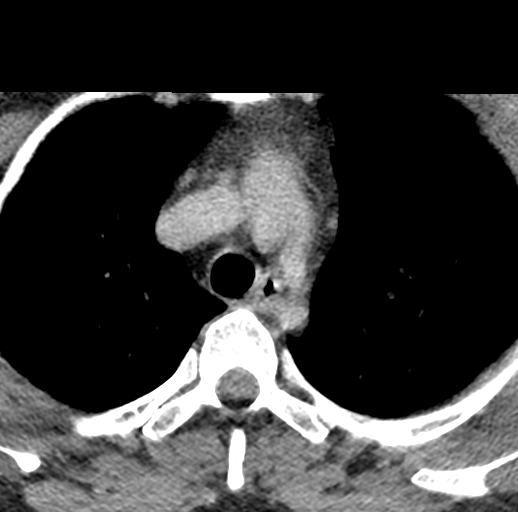
[im 23/134  bone]
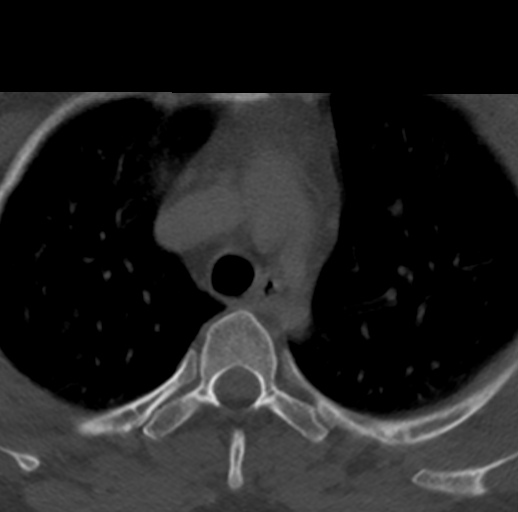
[im 45/134  bone]
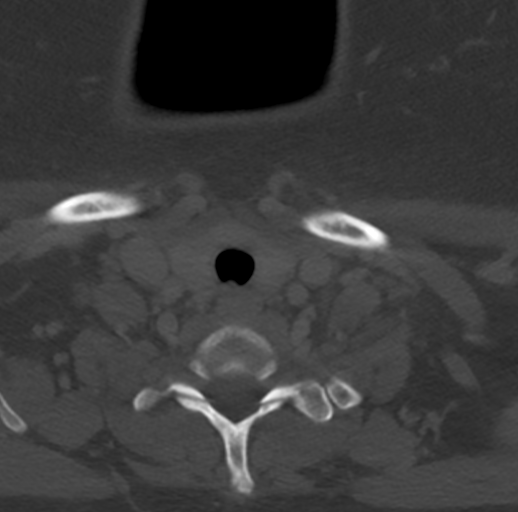
[im 67/134  bone]
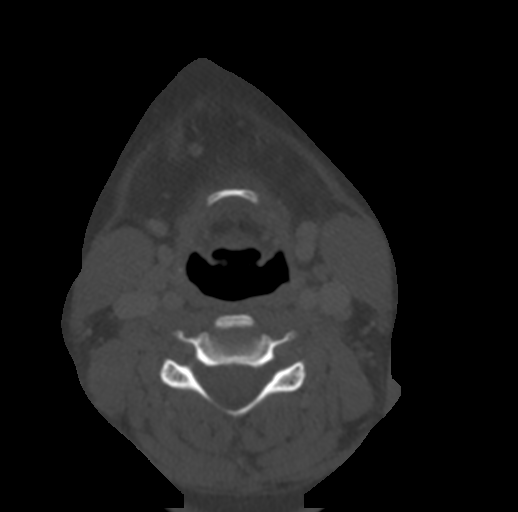
[im 89/134  bone]
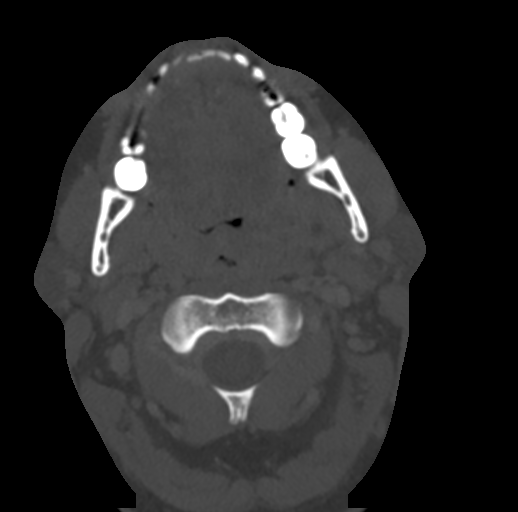
[im 111/134  soft-tissue]
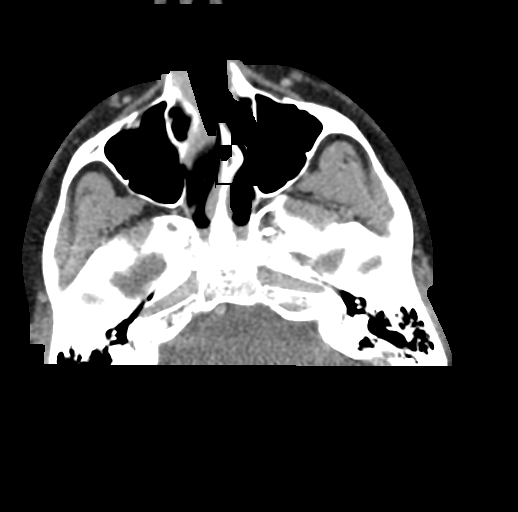
[im 111/134  bone]
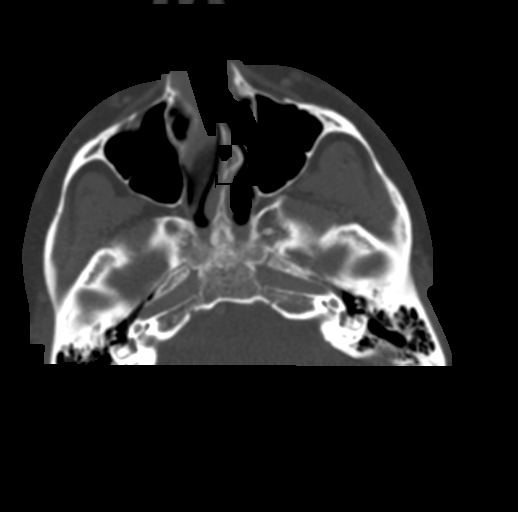

[13 of 33 positions shown; findings below may reference images not displayed]

FINDINGS: Pharynx and larynx: Enlargement, enhancement, and striated
appearance of the lingual, palatine, and adenoid tonsils compatible
with acute tonsillitis. No findings of epiglottitis or laryngitis.
No discrete abscess identified. No retropharyngeal fluid collection.
Mucosal enhancement of the oropharynx.

Salivary glands: No inflammation, mass, or stone.

Thyroid: Normal.

Lymph nodes: Mildly cervical lymphadenopathy. No lymph node necrosis
or lymph node abscess.

Vascular: Negative.

Limited intracranial: Negative.

Visualized orbits: Negative.

Mastoids and visualized paranasal sinuses: Clear.

Skeleton: No acute or aggressive process.

Upper chest: Negative.

Other: None.
IMPRESSION: Acute tonsillitis and pharyngitis. No epiglottitis, laryngitis,
retropharyngeal fluid collection, or discrete abscess identified.
Reactive cervical lymphadenopathy.

## 2020-01-18 ENCOUNTER — Ambulatory Visit: Payer: 59

## 2020-01-18 ENCOUNTER — Encounter: Payer: Self-pay | Admitting: Obstetrics and Gynecology

## 2020-01-18 ENCOUNTER — Other Ambulatory Visit (HOSPITAL_COMMUNITY)
Admission: RE | Admit: 2020-01-18 | Discharge: 2020-01-18 | Disposition: A | Discharge status: Home / Self Care | Payer: 59 | Source: Ambulatory Visit | Attending: Obstetrics and Gynecology | Admitting: Obstetrics and Gynecology

## 2020-01-18 ENCOUNTER — Ambulatory Visit (INDEPENDENT_AMBULATORY_CARE_PROVIDER_SITE_OTHER): Payer: 59 | Admitting: Obstetrics and Gynecology

## 2020-01-18 ENCOUNTER — Other Ambulatory Visit: Payer: Self-pay

## 2020-01-18 ENCOUNTER — Ambulatory Visit
Admission: RE | Admit: 2020-01-18 | Discharge: 2020-01-18 | Disposition: A | Discharge status: Home / Self Care | Payer: 59 | Source: Ambulatory Visit | Attending: Obstetrics and Gynecology | Admitting: Obstetrics and Gynecology

## 2020-01-18 VITALS — BP 114/70 | Wt 152.0 lb

## 2020-01-18 DIAGNOSIS — Z3689 Encounter for other specified antenatal screening: Secondary | ICD-10-CM

## 2020-01-18 DIAGNOSIS — Z3490 Encounter for supervision of normal pregnancy, unspecified, unspecified trimester: Secondary | ICD-10-CM | POA: Diagnosis not present

## 2020-01-18 DIAGNOSIS — O09529 Supervision of elderly multigravida, unspecified trimester: Secondary | ICD-10-CM | POA: Insufficient documentation

## 2020-01-18 DIAGNOSIS — Z113 Encounter for screening for infections with a predominantly sexual mode of transmission: Secondary | ICD-10-CM

## 2020-01-18 DIAGNOSIS — N912 Amenorrhea, unspecified: Secondary | ICD-10-CM

## 2020-01-18 DIAGNOSIS — O09299 Supervision of pregnancy with other poor reproductive or obstetric history, unspecified trimester: Secondary | ICD-10-CM

## 2020-01-18 DIAGNOSIS — Z3A10 10 weeks gestation of pregnancy: Secondary | ICD-10-CM

## 2020-01-18 DIAGNOSIS — Z31438 Encounter for other genetic testing of female for procreative management: Secondary | ICD-10-CM

## 2020-01-18 DIAGNOSIS — N6320 Unspecified lump in the left breast, unspecified quadrant: Secondary | ICD-10-CM | POA: Diagnosis not present

## 2020-01-18 DIAGNOSIS — Z8632 Personal history of gestational diabetes: Secondary | ICD-10-CM

## 2020-01-18 DIAGNOSIS — R928 Other abnormal and inconclusive findings on diagnostic imaging of breast: Secondary | ICD-10-CM | POA: Insufficient documentation

## 2020-01-18 DIAGNOSIS — O09291 Supervision of pregnancy with other poor reproductive or obstetric history, first trimester: Secondary | ICD-10-CM

## 2020-01-18 DIAGNOSIS — O09521 Supervision of elderly multigravida, first trimester: Secondary | ICD-10-CM

## 2020-01-18 LAB — POCT URINE PREGNANCY: Preg Test, Ur: POSITIVE — AB

## 2020-01-18 MED ORDER — ONDANSETRON 4 MG PO TBDP
4.0000 mg | ORAL_TABLET | Freq: Four times a day (QID) | ORAL | 0 refills | Status: DC | PRN
Start: 2020-01-18 — End: 2020-02-23

## 2020-01-18 NOTE — Progress Notes (Signed)
New Obstetric Patient H&P    Chief Complaint: "Desires prenatal care"   History of Present Illness: Patient is a 34 y.o. [id number] Not Hispanic or Latino female, presents with amenorrhea and positive home pregnancy test. Patient's last menstrual period was 11/08/2019. and based on her  LMP, her EDD is Estimated Date of Delivery: 08/14/20 and her EGA is [redacted]w[redacted]d. Her last pap smear was 07/07/2018 NILM HPV negative.  Since her LMP she claims she has experienced nausea, fatigue, breast tenderness. She denies vaginal bleeding but has some light brown discharge shortly after her initial positive UPT. Her past medical history is noncontributory. Her prior pregnancies are notable for gestational DM with G2 (G1 ectopic, G2 no GDM)  Since her LMP, she admits to the use of tobacco products  no There are cats in the home in the home  yes If yes Indoor She admits close contact with children on a regular basis  yes  She has had chicken pox in the past yes She has had Tuberculosis exposures, symptoms, or previously tested positive for TB   no Current or past history of domestic violence. no  Genetic Screening/Teratology Counseling: (Includes patient, baby's father, or anyone in either family with:)   1. Patient's age >/= 30 at [facility]  yes 2. Thalassemia ([place], [place], Mediterranean, or Asian background): MCV<80  no 3. Neural tube defect (meningomyelocele, spina bifida, anencephaly)  no 4. Congenital heart defect  no  5. Down syndrome  no 6. Tay-Sachs (Jewish, [place]  no 7. Canavan's Disease  no 8. Sickle cell disease or trait (African)  no  9. Hemophilia or other blood disorders  no  10. Muscular dystrophy  no  11. Cystic fibrosis  no  12. Huntington's Chorea  no  13. Mental retardation/autism  no 14. Other inherited genetic or chromosomal disorder  no 15. Maternal metabolic disorder (DM, PKU, etc)  no 16. Patient or FOB with a child with a birth defect not listed above no  16a. Patient or  FOB with a birth defect themselves no 17. Recurrent pregnancy loss, or stillbirth  no  18. Any medications since LMP other than prenatal vitamins (include vitamins, supplements, OTC meds, drugs, alcohol)  no 19. Any other genetic/environmental exposure to discuss  no  Infection History:   1. Lives with someone with TB or TB exposed  no  2. Patient or partner has history of genital herpes  no 3. Rash or viral illness since LMP  no 4. History of STI (GC, CT, HPV, syphilis, HIV)  no 5. History of recent travel :  no  Other pertinent information:  no     Review of Systems:10 point review of systems negative unless otherwise noted in HPI  Past Medical History:  Patient Active Problem List   Diagnosis Date Noted  . Acute appendicitis with localized peritonitis 03/30/2018  . Postpartum care following vaginal delivery 03/25/2017  . Encounter for supervision of low-risk pregnancy, antepartum 10/01/2016    Clinic [facility] Prenatal Labs  Dating LMP= [place] at 14wk6days Blood type: O/Positive/-- (04/27 0943) O positive  Genetic Screen Declines Antibody:Negative (04/27 0943)negative  Anatomic [place]  Rubella: Immune 2.76 (04/27 4782) Varicella: I  GTT Early:   118            Third trimester: 190 3hour: 85/183/154/115 RPR: Non Reactive (08/06 0838) negative  Rhogam  HBsAg: Negative (04/27 0943) negative  TDaP vaccine      02/12/2017  Flu Shot: HIV:   negative  Baby Food      BREAST                          GBS: Neg 9/20  Contraception CONDOMS Pap:  CBB     CS/VBAC    Support Person              Past Surgical History:  Past Surgical History:  Procedure Laterality Date  . DILATION AND CURETTAGE OF UTERUS    . LAPAROSCOPIC APPENDECTOMY N/A 03/30/2018   Procedure: APPENDECTOMY LAPAROSCOPIC;  Surgeon: Carolan Shiver, MD;  Location: [facility];  Service: General;  Laterality: N/A;  . LAPAROSCOPY    . TONSILLECTOMY Bilateral 08/12/2018   Procedure: TONSILLECTOMY;  Surgeon:  Geanie Logan, MD;  [facility];  Service: ENT;  Laterality: Bilateral;  Latex senstivity Upreg    Gynecologic History: Patient's last menstrual period was 11/08/2019.  Obstetric History: [id number]  Family History:  Family History  Problem Relation Age of Onset  . Diabetes Father   . Diabetes Maternal Grandfather   . Diabetes Paternal Grandfather   . Breast cancer Maternal Aunt 13    Social History:  Social History   Socioeconomic History  . Marital status: Married    Spouse name: Not on file  . Number of children: Not on file  . Years of education: Not on file  . Highest education level: Not on file  Occupational History  . Occupation: [employer]  Tobacco Use  . Smoking status: Current Every Day Smoker    Packs/day: 0.25    Years: 15.00    Pack years: 3.75  . Smokeless tobacco: Never Used  . Tobacco comment: trying to quit-smokes 1-3 cigarettes/day  Vaping Use  . Vaping Use: Never used  Substance and Sexual Activity  . Alcohol use: Yes    Alcohol/week: 8.0 standard drinks    Types: 4 Cans of beer, 4 Shots of liquor per week  . Drug use: Yes    Types: Marijuana  . Sexual activity: Yes    Birth control/protection: None  Other Topics Concern  . Not on file  Social History Narrative  . Not on file   Social Determinants of Health   Financial Resource Strain:   . Difficulty of Paying Living Expenses:   Food Insecurity:   . Worried About Programme researcher, broadcasting/film/video in the Last Year:   . Barista in the Last Year:   Transportation Needs:   . Freight forwarder (Medical):   Marland Kitchen Lack of Transportation (Non-Medical):   Physical Activity:   . Days of Exercise per Week:   . Minutes of Exercise per Session:   Stress:   . Feeling of Stress :   Social Connections:   . Frequency of Communication with Friends and Family:   . Frequency of Social Gatherings with Friends and Family:   . Attends Religious Services:   . Active Member of Clubs or  Organizations:   . Attends Banker Meetings:   Marland Kitchen Marital Status:   Intimate Partner Violence:   . Fear of Current or Ex-Partner:   . Emotionally Abused:   Marland Kitchen Physically Abused:   . Sexually Abused:     Allergies:  Allergies  Allergen Reactions  . Latex Rash    (gloves and condoms)  . Other Rash    chloraprep  . Sulfa Antibiotics Hives and Rash    Childhood reaction  Medications: Prior to Admission medications   Medication Sig Start Date End Date Taking? Authorizing Provider  HYDROcodone-acetaminophen (HYCET) 7.5-325 mg/15 ml solution 10-15 cc PO Q 4-6 hours as needed for pain 08/12/18   Geanie Logan, MD  ketorolac (TORADOL) 10 MG tablet Take 1 tablet (10 mg total) by mouth every 6 (six) hours as needed. 06/17/19   Emily Filbert, MD  prednisoLONE (ORAPRED) 15 MG/5ML solution 10 cc PO BID x 3 days, then 5cc PO BID x 3 days, then 5cc PO QD x 3 days 08/12/18   Geanie Logan, MD    Physical Exam Vitals: Blood pressure 114/70, weight 152 lb (68.9 kg), last menstrual period 11/08/2019, unknown if currently breastfeeding.  General: NAD HEENT: normocephalic, anicteric Breast symmetrical, no tenderness, no palpable nodules or masses, no skin or nipple retraction present, no nipple discharge.  No axillary or supraclavicular lymphadenopathy. Pulmonary: No increased work of breathing, CTAB Cardiovascular: RRR, distal pulses 2+ Abdomen: NABS, soft, non-tender, non-distended.  Umbilicus without lesions.  No hepatomegaly, splenomegaly or masses palpable. No evidence of hernia  Genitourinary:  External: Normal external female genitalia.  Normal urethral meatus, normal Bartholin's and Skene's glands.    Vagina: Normal vaginal mucosa, no evidence of prolapse.    Cervix: Grossly normal in appearance, no bleeding  Uterus: \ Non-enlarged, mobile, normal contour.  No CMT  Adnexa: ovaries non-enlarged, no adnexal masses  Rectal: deferred Extremities: no edema, erythema, or  tenderness Neurologic: Grossly intact Psychiatric: mood appropriate, affect full   Assessment: 34 y.o. [id number] at [redacted]w[redacted]d presenting to initiate prenatal care  Plan: 1) Avoid alcoholic beverages. 2) Patient encouraged not to smoke.  3) Discontinue the use of all non-medicinal drugs and chemicals.  4) Take prenatal vitamins daily.  5) Nutrition, food safety (fish, cheese advisories, and high nitrite foods) and exercise discussed. 6) Hospital and practice style discussed with cross coverage system.  7) Genetic Screening, such as with 1st Trimester Screening, cell free fetal DNA, AFP testing, and Ultrasound, as well as with amniocentesis and CVS as appropriate, is discussed with patient. At the conclusion of today's visit patient requested genetic testing 8) Hx GDM G2 - HgbA1C early glucol 9) Hyperemesis gravidarum - Rx Zofran 10) Insomnia 0 discussed unisom if no improvement trial of trazedone 11) Hx Abnormal mammogram - Due for follow up mammogram and ultrasound.  Will change to only ultrasound given first trimester pregnancy  Vena Austria, MD, [facility], [facility] 01/18/2020, 11:18 AM

## 2020-01-18 NOTE — Progress Notes (Signed)
NOB 

## 2020-01-19 LAB — RPR+RH+ABO+RUB AB+AB SCR+CB...
Antibody Screen: NEGATIVE
HIV Screen 4th Generation wRfx: NONREACTIVE
Hematocrit: 35.9 % (ref 34.0–46.6)
Hemoglobin: 12.4 g/dL (ref 11.1–15.9)
Hepatitis B Surface Ag: NEGATIVE
MCH: 30.1 pg (ref 26.6–33.0)
MCHC: 34.5 g/dL (ref 31.5–35.7)
MCV: 87 fL (ref 79–97)
Platelets: 346 10*3/uL (ref 150–450)
RBC: 4.12 x10E6/uL (ref 3.77–5.28)
RDW: 14.9 % (ref 11.7–15.4)
RPR Ser Ql: NONREACTIVE
Rh Factor: POSITIVE
Rubella Antibodies, IGG: 3.85 index (ref >0.99)
Varicella zoster IgG: 732 index (ref >165)
WBC: 10.7 10*3/uL (ref 3.4–10.8)

## 2020-01-19 LAB — CERVICOVAGINAL ANCILLARY ONLY
Chlamydia: NEGATIVE
Comment: NEGATIVE
Comment: NORMAL
Neisseria Gonorrhea: NEGATIVE

## 2020-01-19 LAB — HEMOGLOBIN A1C
Est. average glucose Bld gHb Est-mCnc: 123 mg/dL
Hgb A1c MFr Bld: 5.9 % — ABNORMAL HIGH (ref 4.8–5.6)

## 2020-01-20 LAB — URINE CULTURE

## 2020-01-26 ENCOUNTER — Ambulatory Visit (INDEPENDENT_AMBULATORY_CARE_PROVIDER_SITE_OTHER): Payer: 59 | Admitting: Obstetrics and Gynecology

## 2020-01-26 ENCOUNTER — Encounter: Payer: Self-pay | Admitting: Obstetrics and Gynecology

## 2020-01-26 ENCOUNTER — Other Ambulatory Visit: Payer: Self-pay

## 2020-01-26 ENCOUNTER — Ambulatory Visit (INDEPENDENT_AMBULATORY_CARE_PROVIDER_SITE_OTHER): Payer: 59

## 2020-01-26 VITALS — BP 110/70 | Ht 64.0 in | Wt 148.6 lb

## 2020-01-26 DIAGNOSIS — Z3490 Encounter for supervision of normal pregnancy, unspecified, unspecified trimester: Secondary | ICD-10-CM

## 2020-01-26 DIAGNOSIS — Z3A11 11 weeks gestation of pregnancy: Secondary | ICD-10-CM | POA: Diagnosis not present

## 2020-01-26 DIAGNOSIS — Z3491 Encounter for supervision of normal pregnancy, unspecified, first trimester: Secondary | ICD-10-CM | POA: Diagnosis not present

## 2020-01-26 DIAGNOSIS — Z1379 Encounter for other screening for genetic and chromosomal anomalies: Secondary | ICD-10-CM

## 2020-01-26 DIAGNOSIS — Z3480 Encounter for supervision of other normal pregnancy, unspecified trimester: Secondary | ICD-10-CM

## 2020-01-26 DIAGNOSIS — O0990 Supervision of high risk pregnancy, unspecified, unspecified trimester: Secondary | ICD-10-CM | POA: Insufficient documentation

## 2020-01-26 DIAGNOSIS — Z3689 Encounter for other specified antenatal screening: Secondary | ICD-10-CM

## 2020-01-26 LAB — POCT URINALYSIS DIPSTICK OB
Glucose, UA: NEGATIVE
POC,PROTEIN,UA: NEGATIVE

## 2020-01-26 NOTE — Patient Instructions (Signed)
First Trimester of Pregnancy The first trimester of pregnancy is from week 1 until the end of week 13 (months 1 through 3). A week after a sperm fertilizes an egg, the egg will implant on the wall of the uterus. This embryo will begin to develop into a baby. Genes from you and your partner will form the baby. The female genes will determine whether the baby will be a boy or a girl. At 6-8 weeks, the eyes and face will be formed, and the heartbeat can be seen on ultrasound. At the end of 12 weeks, all the baby's organs will be formed. Now that you are pregnant, you will want to do everything you can to have a healthy baby. Two of the most important things are to get good prenatal care and to follow your health care provider's instructions. Prenatal care is all the medical care you receive before the baby's birth. This care will help prevent, find, and treat any problems during the pregnancy and childbirth. Body changes during your first trimester Your body goes through many changes during pregnancy. The changes vary from woman to woman.  You may gain or lose a couple of pounds at first.  You may feel sick to your stomach (nauseous) and you may throw up (vomit). If the vomiting is uncontrollable, call your health care provider.  You may tire easily.  You may develop headaches that can be relieved by medicines. All medicines should be approved by your health care provider.  You may urinate more often. Painful urination may mean you have a bladder infection.  You may develop heartburn as a result of your pregnancy.  You may develop constipation because certain hormones are causing the muscles that push stool through your intestines to slow down.  You may develop hemorrhoids or swollen veins (varicose veins).  Your breasts may begin to grow larger and become tender. Your nipples may stick out more, and the tissue that surrounds them (areola) may become darker.  Your gums may bleed and may be  sensitive to brushing and flossing.  Dark spots or blotches (chloasma, mask of pregnancy) may develop on your face. This will likely fade after the baby is born.  Your menstrual periods will stop.  You may have a loss of appetite.  You may develop cravings for certain kinds of food.  You may have changes in your emotions from day to day, such as being excited to be pregnant or being concerned that something may go wrong with the pregnancy and baby.  You may have more vivid and strange dreams.  You may have changes in your hair. These can include thickening of your hair, rapid growth, and changes in texture. Some women also have hair loss during or after pregnancy, or hair that feels dry or thin. Your hair will most likely return to normal after your baby is born. What to expect at prenatal visits During a routine prenatal visit:  You will be weighed to make sure you and the baby are growing normally.  Your blood pressure will be taken.  Your abdomen will be measured to track your baby's growth.  The fetal heartbeat will be listened to between weeks 10 and 14 of your pregnancy.  Test results from any previous visits will be discussed. Your health care provider may ask you:  How you are feeling.  If you are feeling the baby move.  If you have had any abnormal symptoms, such as leaking fluid, bleeding, severe headaches, or abdominal   cramping.  If you are using any tobacco products, including cigarettes, chewing tobacco, and electronic cigarettes.  If you have any questions. Other tests that may be performed during your first trimester include:  Blood tests to find your blood type and to check for the presence of any previous infections. The tests will also be used to check for low iron levels (anemia) and protein on red blood cells (Rh antibodies). Depending on your risk factors, or if you previously had diabetes during pregnancy, you may have tests to check for high blood sugar  that affects pregnant women (gestational diabetes).  Urine tests to check for infections, diabetes, or protein in the urine.  An ultrasound to confirm the proper growth and development of the baby.  Fetal screens for spinal cord problems (spina bifida) and Down syndrome.  HIV (human immunodeficiency virus) testing. Routine prenatal testing includes screening for HIV, unless you choose not to have this test.  You may need other tests to make sure you and the baby are doing well. Follow these instructions at home: Medicines  Follow your health care provider's instructions regarding medicine use. Specific medicines may be either safe or unsafe to take during pregnancy.  Take a prenatal vitamin that contains at least 600 micrograms (mcg) of folic acid.  If you develop constipation, try taking a stool softener if your health care provider approves. Eating and drinking   Eat a balanced diet that includes fresh fruits and vegetables, whole grains, good sources of protein such as meat, eggs, or tofu, and low-fat dairy. Your health care provider will help you determine the amount of weight gain that is right for you.  Avoid raw meat and uncooked cheese. These carry germs that can cause birth defects in the baby.  Eating four or five small meals rather than three large meals a day may help relieve nausea and vomiting. If you start to feel nauseous, eating a few soda crackers can be helpful. Drinking liquids between meals, instead of during meals, also seems to help ease nausea and vomiting.  Limit foods that are high in fat and processed sugars, such as fried and sweet foods.  To prevent constipation: ? Eat foods that are high in fiber, such as fresh fruits and vegetables, whole grains, and beans. ? Drink enough fluid to keep your urine clear or pale yellow. Activity  Exercise only as directed by your health care provider. Most women can continue their usual exercise routine during  pregnancy. Try to exercise for 30 minutes at least 5 days a week. Exercising will help you: ? Control your weight. ? Stay in shape. ? Be prepared for labor and delivery.  Experiencing pain or cramping in the lower abdomen or lower back is a good sign that you should stop exercising. Check with your health care provider before continuing with normal exercises.  Try to avoid standing for long periods of time. Move your legs often if you must stand in one place for a long time.  Avoid heavy lifting.  Wear low-heeled shoes and practice good posture.  You may continue to have sex unless your health care provider tells you not to. Relieving pain and discomfort  Wear a good support bra to relieve breast tenderness.  Take warm sitz baths to soothe any pain or discomfort caused by hemorrhoids. Use hemorrhoid cream if your health care provider approves.  Rest with your legs elevated if you have leg cramps or low back pain.  If you develop varicose veins in   your legs, wear support hose. Elevate your feet for 15 minutes, 3-4 times a day. Limit salt in your diet. Prenatal care  Schedule your prenatal visits by the twelfth week of pregnancy. They are usually scheduled monthly at first, then more often in the last 2 months before delivery.  Write down your questions. Take them to your prenatal visits.  Keep all your prenatal visits as told by your health care provider. This is important. Safety  Wear your seat belt at all times when driving.  Make a list of emergency phone numbers, including numbers for family, friends, the hospital, and police and fire departments. General instructions  Ask your health care provider for a referral to a local prenatal education class. Begin classes no later than the beginning of month 6 of your pregnancy.  Ask for help if you have counseling or nutritional needs during pregnancy. Your health care provider can offer advice or refer you to specialists for help  with various needs.  Do not use hot tubs, steam rooms, or saunas.  Do not douche or use tampons or scented sanitary pads.  Do not cross your legs for long periods of time.  Avoid cat litter boxes and soil used by cats. These carry germs that can cause birth defects in the baby and possibly loss of the fetus by miscarriage or stillbirth.  Avoid all smoking, herbs, alcohol, and medicines not prescribed by your health care provider. Chemicals in these products affect the formation and growth of the baby.  Do not use any products that contain nicotine or tobacco, such as cigarettes and e-cigarettes. If you need help quitting, ask your health care provider. You may receive counseling support and other resources to help you quit.  Schedule a dentist appointment. At home, brush your teeth with a soft toothbrush and be gentle when you floss. Contact a health care provider if:  You have dizziness.  You have mild pelvic cramps, pelvic pressure, or nagging pain in the abdominal area.  You have persistent nausea, vomiting, or diarrhea.  You have a bad smelling vaginal discharge.  You have pain when you urinate.  You notice increased swelling in your face, hands, legs, or ankles.  You are exposed to fifth disease or chickenpox.  You are exposed to German measles (rubella) and have never had it. Get help right away if:  You have a fever.  You are leaking fluid from your vagina.  You have spotting or bleeding from your vagina.  You have severe abdominal cramping or pain.  You have rapid weight gain or loss.  You vomit blood or material that looks like coffee grounds.  You develop a severe headache.  You have shortness of breath.  You have any kind of trauma, such as from a fall or a car accident. Summary  The first trimester of pregnancy is from week 1 until the end of week 13 (months 1 through 3).  Your body goes through many changes during pregnancy. The changes vary from  woman to woman.  You will have routine prenatal visits. During those visits, your health care provider will examine you, discuss any test results you may have, and talk with you about how you are feeling. This information is not intended to replace advice given to you by your health care provider. Make sure you discuss any questions you have with your health care provider. Document Revised: 05/10/2017 Document Reviewed: 05/09/2016 Elsevier Patient Education  2020 [employer]  

## 2020-01-26 NOTE — Progress Notes (Signed)
Routine Prenatal Care Visit  Subjective  [name] [name] is a 34 y.o. [id number] at [redacted]w[redacted]d being seen today for ongoing prenatal care.  She is currently monitored for the following issues for this low-risk pregnancy and has Encounter for supervision of low-risk pregnancy, antepartum; Postpartum care following vaginal delivery; Acute appendicitis with localized peritonitis; BI-RADS category 3 mammogram result; and Antepartum multigravida of advanced maternal age on their problem list.  ----------------------------------------------------------------------------------- Patient reports no complaints.   Contractions: Not present. Vag. Bleeding: None.  Movement: Absent. Denies leaking of fluid.  ----------------------------------------------------------------------------------- The following portions of the patient's history were reviewed and updated as appropriate: allergies, current medications, past family history, past medical history, past social history, past surgical history and problem list. Problem list updated.   Objective  Blood pressure 110/70, height 5\' 4"  (1.626 m), weight 148 lb 9.6 oz (67.4 kg), last menstrual period 11/08/2019, unknown if currently breastfeeding. Pregravid weight 152 lb (68.9 kg) Total Weight Gain -3 lb 6.4 oz (-1.542 kg) Urinalysis:      Fetal Status: Fetal Heart Rate (bpm): 150   Movement: Absent     General:  Alert, oriented and cooperative. Patient is in no acute distress.  Skin: Skin is warm and dry. No rash noted.   Cardiovascular: Normal heart rate noted  Respiratory: Normal respiratory effort, no problems with respiration noted  Abdomen: Soft, gravid, appropriate for gestational age. Pain/Pressure: Absent     Pelvic:  Cervical exam deferred        Extremities: Normal range of motion.     Mental Status: Normal mood and affect. Normal behavior. Normal judgment and thought content.     Assessment   34 y.o. 20 at [redacted]w[redacted]d by  08/14/2020, by  Last Menstrual Period presenting for routine prenatal visit  Plan   Pregnancy#4 Problems (from 11/08/19 to present)    Problem Noted Resolved   Supervision of other normal pregnancy, antepartum 01/26/2020 by 01/28/2020, MD No   Overview Signed 01/26/2020 12:26 PM by 01/28/2020, MD     Nursing Staff Provider  Office Location  [facility] Dating   LMP = 11 wk Natale Milch  Language  English Anatomy US    Flu Vaccine   Genetic Screen  NIPS:      TDaP vaccine    Hgb A1C or  GTT Early : Third trimester :   Rhogam     LAB RESULTS   Feeding Plan  Blood Type O/Positive/-- (08/09 1157)   Contraception  Antibody Negative (08/09 1157)  Circumcision  Rubella 3.85 (08/09 1157)  Pediatrician   RPR Non Reactive (08/09 1157)   Support Person  HBsAg Negative (08/09 1157)   Prenatal Classes  HIV Non Reactive (08/09 1157)    Varicella immune  BTL Consent  GBS  (For PCN allergy, check sensitivities)        VBAC Consent  Pap  2020 NIL    Hgb Electro      CF  neg     SMA  neg      Covid vaccine April 2020         Antepartum multigravida of advanced maternal age 09/18/2019 by 03/19/2020, MD No       Gestational age appropriate obstetric precautions including but not limited to vaginal bleeding, contractions, leaking of fluid and fetal movement were reviewed in detail with the patient.    Return in about 4 weeks (around 02/23/2020) for ROB in person.  02/25/2020 MD [facility],  [facility] 01/26/2020, 12:23 PM

## 2020-01-26 NOTE — Addendum Note (Signed)
Addended by: Clement Husbands A on: 01/26/2020 04:49 PM   Modules accepted: Orders

## 2020-01-27 ENCOUNTER — Other Ambulatory Visit: Payer: Self-pay | Admitting: Obstetrics and Gynecology

## 2020-01-27 DIAGNOSIS — O09529 Supervision of elderly multigravida, unspecified trimester: Secondary | ICD-10-CM

## 2020-01-27 DIAGNOSIS — Z3480 Encounter for supervision of other normal pregnancy, unspecified trimester: Secondary | ICD-10-CM

## 2020-01-27 DIAGNOSIS — O26899 Other specified pregnancy related conditions, unspecified trimester: Secondary | ICD-10-CM

## 2020-01-27 LAB — INHERITEST CORE(CF97,SMA,FRAX)

## 2020-01-31 LAB — MATERNIT21 PLUS CORE+SCA
Fetal Fraction: 9
Monosomy X (Turner Syndrome): NOT DETECTED
Result (T21): NEGATIVE
Trisomy 13 (Patau syndrome): NEGATIVE
Trisomy 18 (Edwards syndrome): NEGATIVE
Trisomy 21 (Down syndrome): NEGATIVE
XXX (Triple X Syndrome): NOT DETECTED
XXY (Klinefelter Syndrome): NOT DETECTED
XYY (Jacobs Syndrome): NOT DETECTED

## 2020-02-01 ENCOUNTER — Other Ambulatory Visit: Payer: 59

## 2020-02-23 ENCOUNTER — Encounter: Payer: Self-pay | Admitting: Obstetrics and Gynecology

## 2020-02-23 ENCOUNTER — Ambulatory Visit (INDEPENDENT_AMBULATORY_CARE_PROVIDER_SITE_OTHER): Payer: 59 | Admitting: Obstetrics and Gynecology

## 2020-02-23 ENCOUNTER — Other Ambulatory Visit: Payer: Self-pay

## 2020-02-23 VITALS — BP 114/70 | Ht 64.0 in | Wt 156.2 lb

## 2020-02-23 DIAGNOSIS — O09522 Supervision of elderly multigravida, second trimester: Secondary | ICD-10-CM | POA: Diagnosis not present

## 2020-02-23 DIAGNOSIS — O09529 Supervision of elderly multigravida, unspecified trimester: Secondary | ICD-10-CM

## 2020-02-23 DIAGNOSIS — Z3480 Encounter for supervision of other normal pregnancy, unspecified trimester: Secondary | ICD-10-CM

## 2020-02-23 DIAGNOSIS — Z131 Encounter for screening for diabetes mellitus: Secondary | ICD-10-CM

## 2020-02-23 DIAGNOSIS — Z3A15 15 weeks gestation of pregnancy: Secondary | ICD-10-CM

## 2020-02-23 LAB — POCT URINALYSIS DIPSTICK OB
Bilirubin, UA: NEGATIVE
Blood, UA: NEGATIVE
Ketones, UA: NEGATIVE
Nitrite, UA: NEGATIVE
POC,PROTEIN,UA: NEGATIVE
Spec Grav, UA: 1.015 (ref 1.010–1.025)
Urobilinogen, UA: NEGATIVE E.U./dL — AB
pH, UA: 5.5 (ref 5.0–8.0)

## 2020-02-23 LAB — GLUCOSE, POCT (MANUAL RESULT ENTRY): POC Glucose: 199 mg/dl — AB (ref 70–99)

## 2020-02-23 NOTE — Patient Instructions (Addendum)
Gestational Diabetes Mellitus, Self Care When you have gestational diabetes (gestational diabetes mellitus), you must make sure your blood sugar (glucose) stays in a healthy range. You can do this with:  Nutrition.  Exercise.  Lifestyle changes.  Medicines or insulin, if needed.  Support from your doctors and others. If you get treated for this condition, it may not hurt you or your unborn baby (fetus). If you do not get treated for this condition, it may cause problems that can hurt you or your unborn baby. If you get gestational diabetes, you are:  More likely to get it if you get pregnant again.  More likely to develop type 2 diabetes in the future. How to stay aware of blood sugar   Check your blood sugar every day while you are pregnant. Check it as often as told.  Call your doctor if your blood sugar is above your goal numbers for two tests in a row. Your doctor will set personal treatment goals for you. Generally, you should have these blood sugar levels:  Before meals, or after not eating for a long time (fasting or preprandial): at or below 95 mg/dL (5.3 mmol/L).  After meals (postprandial): ? One hour after a meal: at or below 140 mg/dL (7.8 mmol/L). ? Two hours after a meal: at or below 120 mg/dL (6.7 mmol/L).  A1c (hemoglobin A1c) level: 6-6.5%. How to manage high and low blood sugar Signs of high blood sugar High blood sugar is called hyperglycemia. Know the early signs of high blood sugar. Signs may include:  Feeling: ? Thirsty. ? Hungry. ? Very tired.  Needing to pee (urinate) more than usual.  Blurry vision. Signs of low blood sugar Low blood sugar is called hypoglycemia. This is when blood sugar is at or below 70 mg/dL (3.9 mmol/L). Signs may include:  Feeling: ? Hungry. ? Worried or nervous (anxious). ? Sweaty and clammy. ? Confused. ? Dizzy. ? Sleepy. ? Sick to your stomach (nauseous).  Having: ? A fast heartbeat. ? A headache. ? A change  in your vision. ? Tingling or no feeling (numbness) around your mouth, lips, or tongue. ? Jerky movements that you cannot control (seizure).  Having trouble with: ? Moving (coordination). ? Sleeping. ? Passing out (fainting). ? Getting upset easily (irritability). Treating low blood sugar To treat low blood sugar, eat or drink something sugary right away. If you can think clearly and swallow safely, follow the 15:15 rule:  Take 15 grams of a fast-acting carb (carbohydrate). Talk with your doctor about how much you should take.  Some fast-acting carbs are: ? Sugar tablets (glucose pills). Take 3-4 glucose pills. ? 6-8 pieces of hard candy. ? 4-6 oz (120-150 mL) of fruit juice. ? 4-6 oz (120-150 mL) of regular (not diet) soda. ? 1 Tbsp (15 mL) honey or sugar.  Check your blood sugar 15 minutes after you take the carb.  If your blood sugar is still at or below 70 mg/dL (3.9 mmol/L), take 15 grams of a carb again.  If your blood sugar does not go above 70 mg/dL (3.9 mmol/L) after 3 tries, get help right away.  After your blood sugar goes back to normal, eat a meal or a snack within 1 hour. Treating very low blood sugar If your blood sugar is at or below 54 mg/dL (3 mmol/L), you have very low blood sugar (severe hypoglycemia). This is an emergency. Do not wait to see if the symptoms will go away. Get medical help right  away. Call your local emergency services (911 in the [place] If you have very low blood sugar and you cannot eat or drink, you may need a glucagon shot (injection). A family member or friend should learn how to check your blood sugar and how to give you a glucagon shot. Ask your doctor if you need to have a glucagon shot kit at home. Follow these instructions at home: Medicine  Take your insulin and diabetes medicines as told.  If your doctor says you should take more or less insulin or medicines, do this exactly as told.  Do not run out of insulin or  medicines. Food   Make healthy food choices. These include: ? Chicken, fish, egg whites, and beans. ? Oats, whole wheat, bulgur, brown rice, quinoa, and millet. ? Fresh fruits and vegetables. ? Low-fat dairy products. ? Nuts, avocado, olive oil, and canola oil.  Meet with a food specialist (dietitian). He or she can help you make an eating plan that is right for you.  Follow instructions from your doctor about what you cannot eat or drink.  Drink enough fluid to keep your pee (urine) pale yellow.  Eat healthy snacks between healthy meals.  Keep track of carbs that you eat. Do this by reading food labels and learning food serving sizes.  Follow your sick day plan when you cannot eat or drink normally. Make this plan with your doctor so it is ready to use. Activity  Exercise for 30 or more minutes a day, or as much as your doctor recommends.  Talk with your doctor before you start a new exercise or activity. Your doctor may need to tell you to change: ? How much insulin or medicines you take. ? How much food you eat. Lifestyle  Do not drink alcohol.  Do not use any tobacco products. These include cigarettes, chewing tobacco, and e-cigarettes. If you need help quitting, ask your doctor.  Learn how to deal with stress. If you need help with this, ask your doctor. Body care  Stay up to date with your shots (immunizations).  Brush your teeth and gums two times a day. Floss one or more times a day.  Go to the dentist one or more times every 6 months.  Stay at a healthy weight while you are pregnant. General instructions  Take over-the-counter and prescription medicines only as told by your doctor.  Ask your doctor about risks of high blood pressure in pregnancy (preeclampsia and eclampsia).  Share your diabetes care plan with: ? Your work or school. ? People you live with.  Check your pee for ketones: ? When you are sick. ? As told by your doctor.  Carry a card or  wear jewelry that says you have diabetes.  Keep all follow-up visits as told by your doctor. This is important. Care after giving birth  Have your blood sugar checked 4-12 weeks after you give birth.  Get checked for diabetes one or more times during 3 years. Questions to ask your doctor  Do I need to meet with a diabetes educator?  Where can I find a support group for people with gestational diabetes? Where to find more information To learn more about diabetes, visit:  American Diabetes Association: www.diabetes.org  Centers for Disease Control and Prevention (CDC): www.cdc.gov Summary  Check your blood sugar (glucose) every day while you are pregnant. Check it as often as told.  Take your insulin and diabetes medicines as told.  Keep all follow-up visits as   told by your doctor. This is important.  Have your blood sugar checked 4-12 weeks after you give birth. This information is not intended to replace advice given to you by your health care provider. Make sure you discuss any questions you have with your health care provider. Document Revised: 11/18/2017 Document Reviewed: 07/01/2015 Elsevier Patient Education  2020 [employer] Second Trimester of Pregnancy The second trimester is from week 14 through week 27 (months 4 through 6). The second trimester is often a time when you feel your best. Your body has adjusted to being pregnant, and you begin to feel better physically. Usually, morning sickness has lessened or quit completely, you may have more energy, and you may have an increase in appetite. The second trimester is also a time when the fetus is growing rapidly. At the end of the sixth month, the fetus is about 9 inches long and weighs about 1 pounds. You will likely begin to feel the baby move (quickening) between 16 and 20 weeks of pregnancy. Body changes during your second trimester Your body continues to go through many changes during your second trimester. The  changes vary from woman to woman.  Your weight will continue to increase. You will notice your lower abdomen bulging out.  You may begin to get stretch marks on your hips, abdomen, and breasts.  You may develop headaches that can be relieved by medicines. The medicines should be approved by your health care provider.  You may urinate more often because the fetus is pressing on your bladder.  You may develop or continue to have heartburn as a result of your pregnancy.  You may develop constipation because certain hormones are causing the muscles that push waste through your intestines to slow down.  You may develop hemorrhoids or swollen, bulging veins (varicose veins).  You may have back pain. This is caused by: ? Weight gain. ? Pregnancy hormones that are relaxing the joints in your pelvis. ? A shift in weight and the muscles that support your balance.  Your breasts will continue to grow and they will continue to become tender.  Your gums may bleed and may be sensitive to brushing and flossing.  Dark spots or blotches (chloasma, mask of pregnancy) may develop on your face. This will likely fade after the baby is born.  A dark line from your belly button to the pubic area (linea nigra) may appear. This will likely fade after the baby is born.  You may have changes in your hair. These can include thickening of your hair, rapid growth, and changes in texture. Some women also have hair loss during or after pregnancy, or hair that feels dry or thin. Your hair will most likely return to normal after your baby is born. What to expect at prenatal visits During a routine prenatal visit:  You will be weighed to make sure you and the fetus are growing normally.  Your blood pressure will be taken.  Your abdomen will be measured to track your baby's growth.  The fetal heartbeat will be listened to.  Any test results from the previous visit will be discussed. Your health care provider  may ask you:  How you are feeling.  If you are feeling the baby move.  If you have had any abnormal symptoms, such as leaking fluid, bleeding, severe headaches, or abdominal cramping.  If you are using any tobacco products, including cigarettes, chewing tobacco, and electronic cigarettes.  If you have any questions. Other tests that   may be performed during your second trimester include:  Blood tests that check for: ? Low iron levels (anemia). ? High blood sugar that affects pregnant women (gestational diabetes) between 24 and 28 weeks. ? Rh antibodies. This is to check for a protein on red blood cells (Rh factor).  Urine tests to check for infections, diabetes, or protein in the urine.  An ultrasound to confirm the proper growth and development of the baby.  An amniocentesis to check for possible genetic problems.  Fetal screens for spina bifida and Down syndrome.  HIV (human immunodeficiency virus) testing. Routine prenatal testing includes screening for HIV, unless you choose not to have this test. Follow these instructions at home: Medicines  Follow your health care provider's instructions regarding medicine use. Specific medicines may be either safe or unsafe to take during pregnancy.  Take a prenatal vitamin that contains at least 600 micrograms (mcg) of folic acid.  If you develop constipation, try taking a stool softener if your health care provider approves. Eating and drinking   Eat a balanced diet that includes fresh fruits and vegetables, whole grains, good sources of protein such as meat, eggs, or tofu, and low-fat dairy. Your health care provider will help you determine the amount of weight gain that is right for you.  Avoid raw meat and uncooked cheese. These carry germs that can cause birth defects in the baby.  If you have low calcium intake from food, talk to your health care provider about whether you should take a daily calcium supplement.  Limit foods  that are high in fat and processed sugars, such as fried and sweet foods.  To prevent constipation: ? Drink enough fluid to keep your urine clear or pale yellow. ? Eat foods that are high in fiber, such as fresh fruits and vegetables, whole grains, and beans. Activity  Exercise only as directed by your health care provider. Most women can continue their usual exercise routine during pregnancy. Try to exercise for 30 minutes at least 5 days a week. Stop exercising if you experience uterine contractions.  Avoid heavy lifting, wear low heel shoes, and practice good posture.  A sexual relationship may be continued unless your health care provider directs you otherwise. Relieving pain and discomfort  Wear a good support bra to prevent discomfort from breast tenderness.  Take warm sitz baths to soothe any pain or discomfort caused by hemorrhoids. Use hemorrhoid cream if your health care provider approves.  Rest with your legs elevated if you have leg cramps or low back pain.  If you develop varicose veins, wear support hose. Elevate your feet for 15 minutes, 3-4 times a day. Limit salt in your diet. Prenatal Care  Write down your questions. Take them to your prenatal visits.  Keep all your prenatal visits as told by your health care provider. This is important. Safety  Wear your seat belt at all times when driving.  Make a list of emergency phone numbers, including numbers for family, friends, the hospital, and police and fire departments. General instructions  Ask your health care provider for a referral to a local prenatal education class. Begin classes no later than the beginning of month 6 of your pregnancy.  Ask for help if you have counseling or nutritional needs during pregnancy. Your health care provider can offer advice or refer you to specialists for help with various needs.  Do not use hot tubs, steam rooms, or saunas.  Do not douche or use tampons or scented   sanitary  pads.  Do not cross your legs for long periods of time.  Avoid cat litter boxes and soil used by cats. These carry germs that can cause birth defects in the baby and possibly loss of the fetus by miscarriage or stillbirth.  Avoid all smoking, herbs, alcohol, and unprescribed drugs. Chemicals in these products can affect the formation and growth of the baby.  Do not use any products that contain nicotine or tobacco, such as cigarettes and e-cigarettes. If you need help quitting, ask your health care provider.  Visit your dentist if you have not gone yet during your pregnancy. Use a soft toothbrush to brush your teeth and be gentle when you floss. Contact a health care provider if:  You have dizziness.  You have mild pelvic cramps, pelvic pressure, or nagging pain in the abdominal area.  You have persistent nausea, vomiting, or diarrhea.  You have a bad smelling vaginal discharge.  You have pain when you urinate. Get help right away if:  You have a fever.  You are leaking fluid from your vagina.  You have spotting or bleeding from your vagina.  You have severe abdominal cramping or pain.  You have rapid weight gain or weight loss.  You have shortness of breath with chest pain.  You notice sudden or extreme swelling of your face, hands, ankles, feet, or legs.  You have not felt your baby move in over an hour.  You have severe headaches that do not go away when you take medicine.  You have vision changes. Summary  The second trimester is from week 14 through week 27 (months 4 through 6). It is also a time when the fetus is growing rapidly.  Your body goes through many changes during pregnancy. The changes vary from woman to woman.  Avoid all smoking, herbs, alcohol, and unprescribed drugs. These chemicals affect the formation and growth your baby.  Do not use any tobacco products, such as cigarettes, chewing tobacco, and e-cigarettes. If you need help quitting, ask your  health care provider.  Contact your health care provider if you have any questions. Keep all prenatal visits as told by your health care provider. This is important. This information is not intended to replace advice given to you by your health care provider. Make sure you discuss any questions you have with your health care provider. Document Revised: 09/19/2018 Document Reviewed: 07/03/2016 Elsevier Patient Education  2020 [employer] Inc[employer]  

## 2020-02-23 NOTE — Progress Notes (Signed)
Routine Prenatal Care Visit  Subjective  [name] [name] is a 34 y.o. [phone] at [redacted]w[redacted]d being seen today for ongoing prenatal care.  She is currently monitored for the following issues for this low-risk pregnancy and has Postpartum care following vaginal delivery; Acute appendicitis with localized peritonitis; BI-RADS category 3 mammogram result; Antepartum multigravida of advanced maternal age; and Supervision of other normal pregnancy, antepartum on their problem list.  ----------------------------------------------------------------------------------- Patient reports no complaints.   Contractions: Not present. Vag. Bleeding: None.  Movement: Absent. Denies leaking of fluid.  ----------------------------------------------------------------------------------- The following portions of the patient's history were reviewed and updated as appropriate: allergies, current medications, past family history, past medical history, past social history, past surgical history and problem list. Problem list updated.   Objective  Blood pressure 114/70, height 5\' 4"  (1.626 m), weight 156 lb 3.2 oz (70.9 kg), last menstrual period 11/08/2019, unknown if currently breastfeeding. Pregravid weight 152 lb (68.9 kg) Total Weight Gain 4 lb 3.2 oz (1.905 kg) Urinalysis:      Fetal Status: Fetal Heart Rate (bpm): 145   Movement: Absent     General:  Alert, oriented and cooperative. Patient is in no acute distress.  Skin: Skin is warm and dry. No rash noted.   Cardiovascular: Normal heart rate noted  Respiratory: Normal respiratory effort, no problems with respiration noted  Abdomen: Soft, gravid, appropriate for gestational age. Pain/Pressure: Absent     Pelvic:  Cervical exam deferred        Extremities: Normal range of motion.     Mental Status: Normal mood and affect. Normal behavior. Normal judgment and thought content.     Assessment   34 y.o. 20 at [redacted]w[redacted]d by  08/14/2020, by Last  Menstrual Period presenting for routine prenatal visit  Plan   Pregnancy#4 Problems (from 11/08/19 to present)    Problem Noted Resolved   Supervision of other normal pregnancy, antepartum 01/26/2020 by 01/28/2020, MD No   Overview Addendum 02/23/2020 11:17 AM by 02/25/2020, MD     Nursing Staff Provider  Office Location  [facility] Dating   LMP = 11 wk Natale Milch  Language  English Anatomy US    Flu Vaccine   Genetic Screen  NIPS:   Negative Fragile-X, SMA, and CF    TDaP vaccine    Hgb A1C or  GTT Early : baseline HgbA1C 5.9 Third trimester :   Rhogam     LAB RESULTS   Feeding Plan  Blood Type O/Positive/-- (08/09 1157)   Contraception  Antibody Negative (08/09 1157)  Circumcision  Rubella 3.85 (08/09 1157)  Pediatrician   RPR Non Reactive (08/09 1157)   Support Person  HBsAg Negative (08/09 1157)   Prenatal Classes  HIV Non Reactive (08/09 1157)    Varicella  immune  BTL Consent  GBS  (For PCN allergy, check sensitivities)        VBAC Consent  Pap  2020 NIL    Hgb Electro      CF       SMA        Covid vaccine April 2020         Previous Version   Antepartum multigravida of advanced maternal age 34/02/2020 by 03/19/2020, MD No      Random glucose today in the office was 199, ate cereal 1 hour prior Has a history of gestational diabetes Has supplies to check blood sugar at home Will monitor blood glucose and return next week with  log Will obtain 3 hr glucose.   Gestational age appropriate obstetric precautions including but not limited to vaginal bleeding, contractions, leaking of fluid and fetal movement were reviewed in detail with the patient.    Return for 1 GTT ASAP, ROB 1 week -also  will need [place] and ROB in 4 weeks. Natale Milch MD [facility], [facility] 02/23/2020, 11:27 AM

## 2020-02-24 ENCOUNTER — Other Ambulatory Visit: Payer: 59

## 2020-02-24 DIAGNOSIS — Z131 Encounter for screening for diabetes mellitus: Secondary | ICD-10-CM

## 2020-02-25 LAB — GESTATIONAL GLUCOSE TOLERANCE
Glucose, Fasting: 80 mg/dL (ref 65–94)
Glucose, GTT - 1 Hour: 191 mg/dL — ABNORMAL HIGH (ref 65–179)
Glucose, GTT - 2 Hour: 117 mg/dL (ref 65–154)
Glucose, GTT - 3 Hour: 72 mg/dL (ref 65–139)

## 2020-02-25 NOTE — Progress Notes (Signed)
Called and discussed with patient

## 2020-02-29 ENCOUNTER — Encounter: Payer: 59 | Admitting: Obstetrics and Gynecology

## 2020-02-29 ENCOUNTER — Other Ambulatory Visit: Payer: Self-pay | Admitting: Obstetrics and Gynecology

## 2020-02-29 NOTE — Telephone Encounter (Signed)
Advise if refill is appropriate 

## 2020-03-10 ENCOUNTER — Ambulatory Visit
Admission: EM | Admit: 2020-03-10 | Discharge: 2020-03-10 | Disposition: A | Discharge status: Home / Self Care | Payer: 59

## 2020-03-10 DIAGNOSIS — R21 Rash and other nonspecific skin eruption: Secondary | ICD-10-CM

## 2020-03-10 DIAGNOSIS — Z3A17 17 weeks gestation of pregnancy: Secondary | ICD-10-CM

## 2020-03-10 HISTORY — DX: Personal history of other infectious and parasitic diseases: Z86.19

## 2020-03-10 NOTE — Telephone Encounter (Signed)
Pt returned call; please see her msg as well as picture; her PCP doesn't take care of that anymore and she doesn't have a dermatologist.  What to do?

## 2020-03-10 NOTE — ED Provider Notes (Signed)
[name] [name]    CSN: [id number] Arrival date & time: 03/10/20  1141      History   Chief Complaint Chief Complaint  Patient presents with  . Rash    HPI [name] [name] is a 34 y.o. female.   Patient is currently [redacted] weeks pregnant.  She presents with a rash x4 to 5 days.  The rash is pruritic and nontender.  She denies fever, sore throat, chest pain, cough, abdominal pain, or other symptoms.  Treatment attempted at home with Benadryl and hydrocortisone cream.  The history is provided by the patient.    Past Medical History:  Diagnosis Date  . Family history of breast cancer   . Gestational diabetes   . History of peritonsillar abscess   . History of shingles    x6  . Migraines    none in 5 yrs  . Reflux   . Vertigo    while pregnant    Patient Active Problem List   Diagnosis Date Noted  . Supervision of other normal pregnancy, antepartum 01/26/2020  . BI-RADS category 3 mammogram result 01/18/2020  . Antepartum multigravida of advanced maternal age 68/02/2020  . Acute appendicitis with localized peritonitis 03/30/2018  . Postpartum care following vaginal delivery 03/25/2017    Past Surgical History:  Procedure Laterality Date  . DILATION AND CURETTAGE OF UTERUS  2015  . LAPAROSCOPIC APPENDECTOMY N/A 03/30/2018   Procedure: APPENDECTOMY LAPAROSCOPIC;  Surgeon: Carolan Shiver, MD;  Location: [facility];  Service: General;  Laterality: N/A;  . LAPAROSCOPY    . TONSILLECTOMY Bilateral 08/12/2018   Procedure: TONSILLECTOMY;  Surgeon: Geanie Logan, MD;  Location: [facility];  Service: ENT;  Laterality: Bilateral;  Latex senstivity Upreg    OB History    Gravida  4   Para  2   Term  2   Preterm      AB  1   Living  2     SAB      TAB      Ectopic  1   Multiple  0   Live Births  2            Home Medications    Prior to Admission medications   Medication Sig Start Date End Date Taking? Authorizing Provider   Prenatal Vit-Fe Fumarate-FA (PRENATAL MULTIVITAMIN) TABS tablet Take 1 tablet by mouth daily at 12 noon.   Yes [provider]  ondansetron (ZOFRAN-ODT) 4 MG disintegrating tablet DISSOLVE 1 TABLET(4 MG) ON THE TONGUE EVERY 6 HOURS AS NEEDED FOR NAUSEA 02/29/20   Schuman, Jaquelyn Bitter, MD    Family History Family History  Problem Relation Age of Onset  . Diabetes Father   . Diabetes Maternal Grandfather   . Diabetes Paternal Grandfather   . Breast cancer Maternal Aunt 22    Social History Social History   Tobacco Use  . Smoking status: Current Every Day Smoker    Packs/day: 0.25    Years: 15.00    Pack years: 3.75  . Smokeless tobacco: Never Used  . Tobacco comment: trying to quit-smokes 1-3 cigarettes/day  Vaping Use  . Vaping Use: Never used  Substance Use Topics  . Alcohol use: Not Currently    Alcohol/week: 8.0 standard drinks    Types: 4 Cans of beer, 4 Shots of liquor per week  . Drug use: Yes    Types: Marijuana     Allergies   Latex, Other, and Sulfa antibiotics   Review of Systems  Review of Systems  Constitutional: Negative for chills and fever.  HENT: Negative for ear pain and sore throat.   Eyes: Negative for pain and visual disturbance.  Respiratory: Negative for cough and shortness of breath.   Cardiovascular: Negative for chest pain and palpitations.  Gastrointestinal: Negative for abdominal pain and vomiting.  Genitourinary: Negative for dysuria and hematuria.  Musculoskeletal: Negative for arthralgias and back pain.  Skin: Positive for rash. Negative for color change.  Neurological: Negative for seizures and syncope.  All other systems reviewed and are negative.    Physical Exam Triage Vital Signs ED Triage Vitals  Enc Vitals Group     BP 03/10/20 1303 119/83     Pulse Rate 03/10/20 1303 87     Resp 03/10/20 1303 16     Temp 03/10/20 1303 98.8 F (37.1 C)     Temp src --      SpO2 03/10/20 1303 97 %     Weight --      Height  --      Head Circumference --      Peak Flow --      Pain Score 03/10/20 1259 0     Pain Loc --      Pain Edu? --      Excl. in GC? --    No data found.  Updated Vital Signs BP 119/83   Pulse 87   Temp 98.8 F (37.1 C)   Resp 16   LMP 11/08/2019   SpO2 97%   Breastfeeding No   Visual Acuity Right Eye Distance:   Left Eye Distance:   Bilateral Distance:    Right Eye Near:   Left Eye Near:    Bilateral Near:     Physical Exam Vitals and nursing note reviewed.  Constitutional:      General: She is not in acute distress.    Appearance: She is well-developed. She is not ill-appearing.  HENT:     Head: Normocephalic and atraumatic.     Mouth/Throat:     Mouth: Mucous membranes are moist.  Eyes:     Conjunctiva/sclera: Conjunctivae normal.  Cardiovascular:     Rate and Rhythm: Normal rate and regular rhythm.     Heart sounds: No murmur heard.   Pulmonary:     Effort: Pulmonary effort is normal. No respiratory distress.     Breath sounds: Normal breath sounds.  Abdominal:     Palpations: Abdomen is soft.     Tenderness: There is no abdominal tenderness.  Musculoskeletal:     Cervical back: Neck supple.  Skin:    General: Skin is warm and dry.     Findings: Rash present.     Comments: Pink papular rash on trunk and arms.  No drainage.  Neurological:     General: No focal deficit present.     Mental Status: She is alert and oriented to person, place, and time.     Gait: Gait normal.  Psychiatric:        Mood and Affect: Mood normal.        Behavior: Behavior normal.      UC Treatments / Results  Labs (all labs ordered are listed, but only abnormal results are displayed) Labs Reviewed - No data to display  EKG   Radiology No results found.  Procedures Procedures (including critical care time)  Medications Ordered in UC Medications - No data to display  Initial Impression / Assessment and Plan / UC Course  I  have reviewed the triage vital signs  and the nursing notes.  Pertinent labs & imaging results that were available during my care of the patient were reviewed by me and considered in my medical decision making (see chart for details).   Rash, [redacted] weeks pregnant.  Instructed patient to continue Benadryl and hydrocortisone cream.  Instructed her to schedule the soonest available appointment with her OB/GYN for today or tomorrow.  Patient agrees to plan of care.   Final Clinical Impressions(s) / UC Diagnoses   Final diagnoses:  Rash  [redacted] weeks gestation of pregnancy     Discharge Instructions     Take Benadryl as directed.  Continue to apply hydrocortisone cream to the rash.  Schedule the soonest available appointment with your OB/GYN.    ED Prescriptions    None     PDMP not reviewed this encounter.   Mickie Bail, NP 03/10/20 1359

## 2020-03-10 NOTE — Discharge Instructions (Signed)
Take Benadryl as directed.  Continue to apply hydrocortisone cream to the rash.  Schedule the soonest available appointment with your OB/GYN.

## 2020-03-10 NOTE — Telephone Encounter (Signed)
Patient following up, please advise. ?

## 2020-03-10 NOTE — Telephone Encounter (Signed)
Left msg to call me

## 2020-03-10 NOTE — ED Triage Notes (Signed)
Patient c/o rash. Reports she has had shingles six times in the past. Reports this is how it has presented in the past.

## 2020-03-11 ENCOUNTER — Ambulatory Visit (INDEPENDENT_AMBULATORY_CARE_PROVIDER_SITE_OTHER): Payer: 59 | Admitting: Advanced Practice Midwife

## 2020-03-11 ENCOUNTER — Other Ambulatory Visit: Payer: Self-pay

## 2020-03-11 VITALS — BP 100/60 | Wt 151.0 lb

## 2020-03-11 DIAGNOSIS — Z3A17 17 weeks gestation of pregnancy: Secondary | ICD-10-CM

## 2020-03-11 DIAGNOSIS — L2089 Other atopic dermatitis: Secondary | ICD-10-CM

## 2020-03-11 DIAGNOSIS — Z3480 Encounter for supervision of other normal pregnancy, unspecified trimester: Secondary | ICD-10-CM

## 2020-03-11 MED ORDER — CLOBETASOL PROPIONATE 0.05 % EX CREA: TOPICAL_CREAM | CUTANEOUS | 5 refills | Status: DC

## 2020-03-11 NOTE — Progress Notes (Signed)
ER follow up on rash- entire body

## 2020-03-14 ENCOUNTER — Encounter: Payer: Self-pay | Admitting: Advanced Practice Midwife

## 2020-03-14 DIAGNOSIS — L2089 Other atopic dermatitis: Secondary | ICD-10-CM | POA: Insufficient documentation

## 2020-03-14 MED ORDER — TRIAMCINOLONE ACETONIDE 0.1 % EX CREA: TOPICAL_CREAM | CUTANEOUS | 5 refills | Status: DC

## 2020-03-14 NOTE — Progress Notes (Signed)
Routine Prenatal Care Visit  Subjective  [name] [name] is a 34 y.o. [phone] at [redacted]w[redacted]d being seen today for ongoing prenatal care.  She is currently monitored for the following issues for this low-risk pregnancy and has Postpartum care following vaginal delivery; Acute appendicitis with localized peritonitis; BI-RADS category 3 mammogram result; Antepartum multigravida of advanced maternal age; Supervision of other normal pregnancy, antepartum; and Other atopic dermatitis on their problem list.  ----------------------------------------------------------------------------------- Patient reports red itchy rash that started about 6 days ago. The rash started as 3 small bumps on her abdomen. Over the past few days the rash has spread to her chest, arms, back. She has tried both benadryl and cortisone cream without much relief. She has a history of eczema as a child. Dr Jean Rosenthal also examined the patient. We discussed likely diagnosis of Atopic Eruptions of Pregnancy and treatment plan.    Contractions: Not present. Vag. Bleeding: None.  Movement: Present. Leaking Fluid denies.  ----------------------------------------------------------------------------------- The following portions of the patient's history were reviewed and updated as appropriate: allergies, current medications, past family history, past medical history, past social history, past surgical history and problem list. Problem list updated.  Objective  Blood pressure 100/60, weight 151 lb (68.5 kg), last menstrual period 11/08/2019. Pregravid weight 152 lb (68.9 kg) Total Weight Gain -1 lb (-0.454 kg) Urinalysis: Urine Protein    Urine Glucose    Fetal Status: Fetal Heart Rate (bpm): 150   Movement: Present     General:  Alert, oriented and cooperative. Patient is in no acute distress.  Skin: Skin is warm and dry. Generalized red rash in patches and single bumps over upper body. No drainage.   Cardiovascular: Normal heart rate  noted  Respiratory: Normal respiratory effort, no problems with respiration noted  Abdomen: Soft, gravid, appropriate for gestational age. Pain/Pressure: Absent     Pelvic:  Cervical exam deferred        Extremities: Normal range of motion.     Mental Status: Normal mood and affect. Normal behavior. Normal judgment and thought content.   Assessment   34 y.o. [id number] at [redacted]w[redacted]d by  08/14/2020, by Last Menstrual Period presenting for work-in prenatal visit  Plan   Pregnancy#4 Problems (from 11/08/19 to present)    Problem Noted Resolved   Supervision of other normal pregnancy, antepartum 01/26/2020 by Natale Milch, MD No   Overview Addendum 02/23/2020 11:38 AM by Natale Milch, MD     Nursing Staff Provider  Office Location  [facility] Dating   LMP = 11 wk [place]  Language  English Anatomy US    Flu Vaccine   Genetic Screen  NIPS:   Negative Fragile-X, SMA, and CF    TDaP vaccine    Hgb A1C or  GTT Early : baseline HgbA1C 5.9 Third trimester :   Rhogam   not needed   LAB RESULTS   Feeding Plan  Blood Type O/Positive/-- (08/09 1157)   Contraception  Antibody Negative (08/09 1157)  Circumcision  Rubella 3.85 (08/09 1157)  Pediatrician   RPR Non Reactive (08/09 1157)   Support Person  HBsAg Negative (08/09 1157)   Prenatal Classes  HIV Non Reactive (08/09 1157)    Varicella  immune  BTL Consent  GBS  (For PCN allergy, check sensitivities)        VBAC Consent  Pap  2020 NIL    Hgb Electro      CF       SMA  Covid vaccine April 2020         Previous Version   Antepartum multigravida of advanced maternal age 47/02/2020 by Vena Austria, MD No    Rx Kenalog: follow soak and smear treatment: Mix 2 ounces steroid cream with 7 ounces lotion and refrigerate. Soak in warm bath for 15 minutes prior to applying lotion mixture. Apply topically 2 times a day for 2 weeks, 1 time a day for 2 weeks and every other day for 2 weeks. If there are open areas of skin, add 1 cup  vinegar to bath 3 times per week to reduce colonization with staph bacteria.  Preterm labor symptoms and general obstetric precautions including but not limited to vaginal bleeding, contractions, leaking of fluid and fetal movement were reviewed in detail with the patient. Please refer to After Visit Summary for other counseling recommendations.   Return for scheduled prenatal appointment.  Tresea Mall, CNM 03/14/2020 4:24 PM

## 2020-03-14 NOTE — Patient Instructions (Signed)

## 2020-03-22 ENCOUNTER — Encounter: Payer: Self-pay | Admitting: Obstetrics and Gynecology

## 2020-03-22 ENCOUNTER — Ambulatory Visit (INDEPENDENT_AMBULATORY_CARE_PROVIDER_SITE_OTHER): Payer: 59 | Admitting: Obstetrics and Gynecology

## 2020-03-22 ENCOUNTER — Ambulatory Visit (INDEPENDENT_AMBULATORY_CARE_PROVIDER_SITE_OTHER): Payer: 59

## 2020-03-22 ENCOUNTER — Other Ambulatory Visit: Payer: Self-pay

## 2020-03-22 VITALS — BP 114/70 | Ht 64.0 in | Wt 153.6 lb

## 2020-03-22 DIAGNOSIS — Z3480 Encounter for supervision of other normal pregnancy, unspecified trimester: Secondary | ICD-10-CM

## 2020-03-22 DIAGNOSIS — Z3A19 19 weeks gestation of pregnancy: Secondary | ICD-10-CM | POA: Diagnosis not present

## 2020-03-22 DIAGNOSIS — O09529 Supervision of elderly multigravida, unspecified trimester: Secondary | ICD-10-CM

## 2020-03-22 LAB — POCT URINALYSIS DIPSTICK OB
Glucose, UA: NEGATIVE
POC,PROTEIN,UA: NEGATIVE

## 2020-03-22 NOTE — Progress Notes (Signed)
Pt here for Anatomy scan and ROB.

## 2020-03-22 NOTE — Progress Notes (Signed)
Routine Prenatal Care Visit  Subjective  [name] [name] is a 34 y.o. [phone] at [redacted]w[redacted]d being seen today for ongoing prenatal care.  She is currently monitored for the following issues for this low-risk pregnancy and has Postpartum care following vaginal delivery; Acute appendicitis with localized peritonitis; BI-RADS category 3 mammogram result; Antepartum multigravida of advanced maternal age; Supervision of other normal pregnancy, antepartum; and Other atopic dermatitis on their problem list.  ----------------------------------------------------------------------------------- Patient reports no complaints.   Contractions: Not present. Vag. Bleeding: None.  Movement: Present. Denies leaking of fluid.  ----------------------------------------------------------------------------------- The following portions of the patient's history were reviewed and updated as appropriate: allergies, current medications, past family history, past medical history, past social history, past surgical history and problem list. Problem list updated.   Objective  Blood pressure 114/70, height 5\' 4"  (1.626 m), weight 153 lb 9.6 oz (69.7 kg), last menstrual period 11/08/2019. Pregravid weight 152 lb (68.9 kg) Total Weight Gain 1 lb 9.6 oz (0.726 kg) Urinalysis:      Fetal Status: Fetal Heart Rate (bpm): 153   Movement: Present     General:  Alert, oriented and cooperative. Patient is in no acute distress.  Skin: Skin is warm and dry. No rash noted.   Cardiovascular: Normal heart rate noted  Respiratory: Normal respiratory effort, no problems with respiration noted  Abdomen: Soft, gravid, appropriate for gestational age. Pain/Pressure: Absent     Pelvic:  Cervical exam deferred        Extremities: Normal range of motion.     Mental Status: Normal mood and affect. Normal behavior. Normal judgment and thought content.     Assessment   34 y.o. 20 at [redacted]w[redacted]d by  08/14/2020, by Last Menstrual Period  presenting for routine prenatal visit  Plan   Pregnancy#4 Problems (from 11/08/19 to present)    Problem Noted Resolved   Supervision of other normal pregnancy, antepartum 01/26/2020 by 01/28/2020, MD No   Overview Addendum 03/22/2020 10:06 AM by 05/22/2020, MD     Nursing Staff Provider  Office Location  [facility] Dating   LMP = 11 wk Natale Milch  Language  English Anatomy US   incomplete for heart views  Flu Vaccine   Genetic Screen  NIPS:   Negative Fragile-X, SMA, and CF    TDaP vaccine    Hgb A1C or  GTT Early : baseline HgbA1C 5.9 Early 3 hr 1 of 4 elevated Third trimester :   Rhogam   not needed   LAB RESULTS   Feeding Plan  Blood Type O/Positive/-- (08/09 1157)   Contraception  Antibody Negative (08/09 1157)  Circumcision  Rubella 3.85 (08/09 1157)  Pediatrician   RPR Non Reactive (08/09 1157)   Support Person  HBsAg Negative (08/09 1157)   Prenatal Classes  HIV Non Reactive (08/09 1157)    Varicella  immune  BTL Consent  GBS  (For PCN allergy, check sensitivities)        VBAC Consent  Pap  2020 NIL    Hgb Electro      CF       SMA        Covid vaccine April 2020         Previous Version   Antepartum multigravida of advanced maternal age 34/02/2020 by 03/19/2020, MD No     MFM referral for incomplete heart views.    Gestational age appropriate obstetric precautions including but not limited to vaginal bleeding, contractions, leaking of fluid  and fetal movement were reviewed in detail with the patient.    Return in about 4 weeks (around 04/19/2020) for ROB in person.  Natale Milch MD [facility], [facility] 03/22/2020, 10:06 AM

## 2020-03-22 NOTE — Patient Instructions (Signed)

## 2020-03-23 ENCOUNTER — Other Ambulatory Visit: Payer: Self-pay | Admitting: Obstetrics and Gynecology

## 2020-03-23 DIAGNOSIS — Z3689 Encounter for other specified antenatal screening: Secondary | ICD-10-CM

## 2020-03-28 ENCOUNTER — Other Ambulatory Visit: Payer: Self-pay | Admitting: Obstetrics and Gynecology

## 2020-03-28 DIAGNOSIS — O09522 Supervision of elderly multigravida, second trimester: Secondary | ICD-10-CM

## 2020-03-28 DIAGNOSIS — Z3689 Encounter for other specified antenatal screening: Secondary | ICD-10-CM

## 2020-04-04 ENCOUNTER — Ambulatory Visit: Payer: 59 | Attending: Obstetrics and Gynecology

## 2020-04-04 ENCOUNTER — Other Ambulatory Visit: Payer: Self-pay

## 2020-04-04 DIAGNOSIS — O09512 Supervision of elderly primigravida, second trimester: Secondary | ICD-10-CM | POA: Insufficient documentation

## 2020-04-04 DIAGNOSIS — Z3689 Encounter for other specified antenatal screening: Secondary | ICD-10-CM | POA: Diagnosis not present

## 2020-04-04 DIAGNOSIS — O09529 Supervision of elderly multigravida, unspecified trimester: Secondary | ICD-10-CM

## 2020-04-04 DIAGNOSIS — Z3480 Encounter for supervision of other normal pregnancy, unspecified trimester: Secondary | ICD-10-CM

## 2020-04-04 DIAGNOSIS — Z3A21 21 weeks gestation of pregnancy: Secondary | ICD-10-CM | POA: Insufficient documentation

## 2020-04-04 DIAGNOSIS — O09522 Supervision of elderly multigravida, second trimester: Secondary | ICD-10-CM

## 2020-04-19 ENCOUNTER — Other Ambulatory Visit: Payer: Self-pay

## 2020-04-19 ENCOUNTER — Encounter: Payer: Self-pay | Admitting: Obstetrics & Gynecology

## 2020-04-19 ENCOUNTER — Ambulatory Visit (INDEPENDENT_AMBULATORY_CARE_PROVIDER_SITE_OTHER): Payer: 59 | Admitting: Obstetrics & Gynecology

## 2020-04-19 VITALS — BP 110/70 | Wt 153.0 lb

## 2020-04-19 DIAGNOSIS — Z3482 Encounter for supervision of other normal pregnancy, second trimester: Secondary | ICD-10-CM

## 2020-04-19 DIAGNOSIS — O09529 Supervision of elderly multigravida, unspecified trimester: Secondary | ICD-10-CM | POA: Diagnosis not present

## 2020-04-19 DIAGNOSIS — Z3A23 23 weeks gestation of pregnancy: Secondary | ICD-10-CM

## 2020-04-19 DIAGNOSIS — Z23 Encounter for immunization: Secondary | ICD-10-CM | POA: Diagnosis not present

## 2020-04-19 DIAGNOSIS — Z131 Encounter for screening for diabetes mellitus: Secondary | ICD-10-CM

## 2020-04-19 NOTE — Progress Notes (Signed)
  Subjective  Fetal Movement? yes Contractions? no Leaking Fluid? no Vaginal Bleeding? no  Objective  BP 110/70   Wt 153 lb (69.4 kg)   LMP 11/08/2019   BMI 26.26 kg/m  General: NAD Pumonary: no increased work of breathing Abdomen: gravid, non-tender Extremities: no edema Psychiatric: mood appropriate, affect full  Assessment  34 y.o. [id number] at [redacted]w[redacted]d by  08/14/2020, by Last Menstrual Period presenting for routine prenatal visit  Plan   Problem List Items Addressed This Visit    Antepartum multigravida of advanced maternal age    Supervision of other normal pregnancy, antepartum   [redacted] weeks gestation of pregnancy        PNV   Screening for diabetes mellitus        Due to prior abn glucola and prior preg GDM, will sch 3 hr GTT nv   Relevant Orders   Gestational Glucose Tolerance   Need for immunization against influenza       Relevant Orders   Flu Vaccine QUAD 36+ mos IM (Completed)      Pregnancy#4 Problems (from 11/08/19 to present)    Problem     Supervision of other normal pregnancy, antepartum     Overview Addendum 04/19/2020 11:01 AM by Nadara Mustard, MD     Nursing Staff Provider  Office Location  [facility] Dating   LMP = 11 wk [place]  Language  English Anatomy US   incomplete for heart views  Flu Vaccine  04/19/20 Genetic Screen  NIPS:  nml XX Negative Fragile-X, SMA, and CF    TDaP vaccine   at 30 wks Hgb A1C or  GTT Early : baseline HgbA1C 5.9 Early 3 hr 1 of 4 elevated Third trimester :   Rhogam   not needed   LAB RESULTS   Feeding Plan Breast Blood Type O/Positive/-- (08/09 1157)   Contraception Vasec Antibody Negative (08/09 1157)  Circumcision n/a Rubella 3.85 (08/09 1157)  Pediatrician   RPR Non Reactive (08/09 1157)   Support Person Husband HBsAg Negative (08/09 1157)   Prenatal Classes no HIV Non Reactive (08/09 1157)    Varicella  immune  BTL Consent n/a GBS  (For PCN allergy, check sensitivities)   Covid vacc done    VBAC Consent n/a Pap  2020  NIL         Previous Version   Antepartum multigravida of advanced maternal age         Annamarie Major, MD, [facility], [facility] 04/19/2020  11:02 AM

## 2020-04-19 NOTE — Patient Instructions (Signed)

## 2020-05-17 ENCOUNTER — Other Ambulatory Visit: Payer: Self-pay

## 2020-05-17 ENCOUNTER — Ambulatory Visit (INDEPENDENT_AMBULATORY_CARE_PROVIDER_SITE_OTHER): Payer: 59 | Admitting: Obstetrics and Gynecology

## 2020-05-17 ENCOUNTER — Encounter: Payer: Self-pay | Admitting: Obstetrics and Gynecology

## 2020-05-17 ENCOUNTER — Other Ambulatory Visit: Payer: 59

## 2020-05-17 VITALS — BP 114/74 | Wt 155.0 lb

## 2020-05-17 DIAGNOSIS — O09523 Supervision of elderly multigravida, third trimester: Secondary | ICD-10-CM

## 2020-05-17 DIAGNOSIS — O0993 Supervision of high risk pregnancy, unspecified, third trimester: Secondary | ICD-10-CM

## 2020-05-17 DIAGNOSIS — Z113 Encounter for screening for infections with a predominantly sexual mode of transmission: Secondary | ICD-10-CM

## 2020-05-17 DIAGNOSIS — Z131 Encounter for screening for diabetes mellitus: Secondary | ICD-10-CM

## 2020-05-17 DIAGNOSIS — Z3A27 27 weeks gestation of pregnancy: Secondary | ICD-10-CM

## 2020-05-17 NOTE — Progress Notes (Signed)
Routine Prenatal Care Visit  Subjective  [name] [name] is a 34 y.o. [phone] at [redacted]w[redacted]d being seen today for ongoing prenatal care.  She is currently monitored for the following issues for this high-risk pregnancy and has Acute appendicitis with localized peritonitis; BI-RADS category 3 mammogram result; Antepartum multigravida of advanced maternal age; Supervision of high risk pregnancy, antepartum; and Other atopic dermatitis on their problem list.  ----------------------------------------------------------------------------------- Patient reports no complaints.   Contractions: Not present. Vag. Bleeding: None.  Movement: Present. Leaking Fluid denies.  ----------------------------------------------------------------------------------- The following portions of the patient's history were reviewed and updated as appropriate: allergies, current medications, past family history, past medical history, past social history, past surgical history and problem list. Problem list updated.  Objective  Blood pressure 114/74, weight 155 lb (70.3 kg), last menstrual period 11/08/2019. Pregravid weight 152 lb (68.9 kg) Total Weight Gain 3 lb (1.361 kg) Urinalysis: Urine Protein    Urine Glucose    Fetal Status: Fetal Heart Rate (bpm): 140 Fundal Height: 27 cm Movement: Present     General:  Alert, oriented and cooperative. Patient is in no acute distress.  Skin: Skin is warm and dry. No rash noted.   Cardiovascular: Normal heart rate noted  Respiratory: Normal respiratory effort, no problems with respiration noted  Abdomen: Soft, gravid, appropriate for gestational age. Pain/Pressure: Absent     Pelvic:  Cervical exam deferred        Extremities: Normal range of motion.  Edema: None  Mental Status: Normal mood and affect. Normal behavior. Normal judgment and thought content.   Assessment   34 y.o. [id number] at [redacted]w[redacted]d by  08/14/2020, by Last Menstrual Period presenting for routine prenatal  visit  Plan   Pregnancy#4 Problems (from 11/08/19 to present)    Problem Noted Resolved   Supervision of high risk pregnancy, antepartum 01/26/2020 by Natale Milch, MD No   Overview Addendum 04/19/2020 11:01 AM by Nadara Mustard, MD     Nursing Staff Provider  Office Location  [facility] Dating   LMP = 11 wk [place]  Language  English Anatomy US   incomplete for heart views  Flu Vaccine  04/19/20 Genetic Screen  NIPS:  nml XX Negative Fragile-X, SMA, and CF    TDaP vaccine    Hgb A1C or  GTT Early : baseline HgbA1C 5.9 Early 3 hr 1 of 4 elevated Third trimester :   Rhogam   not needed   LAB RESULTS   Feeding Plan Breast Blood Type O/Positive/-- (08/09 1157)   Contraception Vasec Antibody Negative (08/09 1157)  Circumcision n/a Rubella 3.85 (08/09 1157)  Pediatrician   RPR Non Reactive (08/09 1157)   Support Person Husband HBsAg Negative (08/09 1157)   Prenatal Classes no HIV Non Reactive (08/09 1157)    Varicella  immune  BTL Consent n/a GBS  (For PCN allergy, check sensitivities)   Covid vacc done    VBAC Consent n/a Pap  2020 NIL         Previous Version   Antepartum multigravida of advanced maternal age 38/02/2020 by Vena Austria, MD No       Preterm labor symptoms and general obstetric precautions including but not limited to vaginal bleeding, contractions, leaking of fluid and fetal movement were reviewed in detail with the patient. Please refer to After Visit Summary for other counseling recommendations.   - 3h gtt today with other 28 week labs  Return in about 2 weeks (around 05/31/2020) for Routine Prenatal Appointment.  Thomasene Mohair, MD, [facility], [facility] 05/17/2020 10:04 AM

## 2020-05-18 LAB — CBC WITH DIFFERENTIAL/PLATELET
Basophils Absolute: 0 10*3/uL (ref 0.0–0.2)
Basos: 0 %
EOS (ABSOLUTE): 0.2 10*3/uL (ref 0.0–0.4)
Eos: 2 %
Hematocrit: 32.1 % — ABNORMAL LOW (ref 34.0–46.6)
Hemoglobin: 11 g/dL — ABNORMAL LOW (ref 11.1–15.9)
Immature Grans (Abs): 0.2 10*3/uL — ABNORMAL HIGH (ref 0.0–0.1)
Immature Granulocytes: 1 %
Lymphocytes Absolute: 3.1 10*3/uL (ref 0.7–3.1)
Lymphs: 23 %
MCH: 30.6 pg (ref 26.6–33.0)
MCHC: 34.3 g/dL (ref 31.5–35.7)
MCV: 89 fL (ref 79–97)
Monocytes Absolute: 1 10*3/uL — ABNORMAL HIGH (ref 0.1–0.9)
Monocytes: 8 %
Neutrophils Absolute: 9 10*3/uL — ABNORMAL HIGH (ref 1.4–7.0)
Neutrophils: 66 %
Platelets: 360 10*3/uL (ref 150–450)
RBC: 3.6 x10E6/uL — ABNORMAL LOW (ref 3.77–5.28)
RDW: 13.3 % (ref 11.7–15.4)
WBC: 13.5 10*3/uL — ABNORMAL HIGH (ref 3.4–10.8)

## 2020-05-18 LAB — GESTATIONAL GLUCOSE TOLERANCE
Glucose, Fasting: 88 mg/dL (ref 65–94)
Glucose, GTT - 1 Hour: 159 mg/dL (ref 65–179)
Glucose, GTT - 2 Hour: 105 mg/dL (ref 65–154)
Glucose, GTT - 3 Hour: 68 mg/dL (ref 65–139)

## 2020-05-18 LAB — HIV ANTIBODY (ROUTINE TESTING W REFLEX): HIV Screen 4th Generation wRfx: NONREACTIVE

## 2020-05-18 LAB — RPR QUALITATIVE: RPR Ser Ql: NONREACTIVE

## 2020-05-31 ENCOUNTER — Ambulatory Visit (INDEPENDENT_AMBULATORY_CARE_PROVIDER_SITE_OTHER): Payer: 59 | Admitting: Obstetrics

## 2020-05-31 ENCOUNTER — Other Ambulatory Visit: Payer: Self-pay

## 2020-05-31 VITALS — BP 100/56 | Wt 158.0 lb

## 2020-05-31 DIAGNOSIS — O0993 Supervision of high risk pregnancy, unspecified, third trimester: Secondary | ICD-10-CM

## 2020-05-31 DIAGNOSIS — O0990 Supervision of high risk pregnancy, unspecified, unspecified trimester: Secondary | ICD-10-CM

## 2020-05-31 DIAGNOSIS — Z3A29 29 weeks gestation of pregnancy: Secondary | ICD-10-CM

## 2020-05-31 LAB — POCT URINALYSIS DIPSTICK OB
Glucose, UA: NEGATIVE
POC,PROTEIN,UA: NEGATIVE

## 2020-05-31 NOTE — Progress Notes (Signed)
No concerns.rj 

## 2020-05-31 NOTE — Progress Notes (Signed)
  Routine Prenatal Care Visit  Subjective  [name] [name] is a 34 y.o. [phone] at [redacted]w[redacted]d being seen today for ongoing prenatal care.  She is currently monitored for the following issues for this high-risk pregnancy and has Acute appendicitis with localized peritonitis; BI-RADS category 3 mammogram result; Antepartum multigravida of advanced maternal age; Supervision of high risk pregnancy, antepartum; and Other atopic dermatitis on their problem list.  ----------------------------------------------------------------------------------- Patient reports no complaints.    .  .   Pincus Large Fluid denies.  ----------------------------------------------------------------------------------- The following portions of the patient's history were reviewed and updated as appropriate: allergies, current medications, past family history, past medical history, past social history, past surgical history and problem list. Problem list updated.  Objective  Blood pressure (!) 100/56, weight 158 lb (71.7 kg), last menstrual period 11/08/2019. Pregravid weight 152 lb (68.9 kg) Total Weight Gain 6 lb (2.722 kg) Urinalysis: Urine Protein    Urine Glucose    Fetal Status:           General:  Alert, oriented and cooperative. Patient is in no acute distress.  Skin: Skin is warm and dry. No rash noted.   Cardiovascular: Normal heart rate noted  Respiratory: Normal respiratory effort, no problems with respiration noted  Abdomen: Soft, gravid, appropriate for gestational age.       Pelvic:  Cervical exam deferred        Extremities: Normal range of motion.     Mental Status: Normal mood and affect. Normal behavior. Normal judgment and thought content.   Assessment   34 y.o. [id number] at [redacted]w[redacted]d by  08/14/2020, by Last Menstrual Period presenting for routine prenatal visit  Plan   Pregnancy#4 Problems (from 11/08/19 to present)    Problem Noted Resolved   Supervision of high risk pregnancy, antepartum  01/26/2020 by Natale Milch, MD No   Overview Addendum 04/19/2020 11:01 AM by Nadara Mustard, MD     Nursing Staff Provider  Office Location  [facility] Dating   LMP = 11 wk [place]  Language  English Anatomy US   incomplete for heart views  Flu Vaccine  04/19/20 Genetic Screen  NIPS:  nml XX Negative Fragile-X, SMA, and CF    TDaP vaccine    Hgb A1C or  GTT Early : baseline HgbA1C 5.9 Early 3 hr 1 of 4 elevated Third trimester :   Rhogam   not needed   LAB RESULTS   Feeding Plan Breast Blood Type O/Positive/-- (08/09 1157)   Contraception Vasec Antibody Negative (08/09 1157)  Circumcision n/a Rubella 3.85 (08/09 1157)  Pediatrician   RPR Non Reactive (08/09 1157)   Support Person Husband HBsAg Negative (08/09 1157)   Prenatal Classes no HIV Non Reactive (08/09 1157)    Varicella  immune  BTL Consent n/a GBS  (For PCN allergy, check sensitivities)   Covid vacc done    VBAC Consent n/a Pap  2020 NIL         Previous Version   Antepartum multigravida of advanced maternal age 21/02/2020 by Vena Austria, MD No       Preterm labor symptoms and general obstetric precautions including but not limited She had a 3hr GTT and passed it. Smoking addressed, including MJ use. Plans tdap next visit.  Return in about 2 weeks (around 06/14/2020) for return OB.  Mirna Mires, CNM  05/31/2020 9:28 AM

## 2020-05-31 NOTE — Addendum Note (Signed)
Addended by: Loran Senters D on: 05/31/2020 09:45 AM   Modules accepted: Orders

## 2020-06-11 NOTE — L&D Delivery Note (Signed)
Delivery Note At 1236 a viable female was delivered via  vaginal over a small perineal laceration that did not need repair. (Presentation:  ROA, with external restitution first to ROT, then ROP. Compound hand  noted    ).  APGAR: 9,9 ; weight  pending.   Placenta status:  ,delivered at 1245 intact.  .  Cord:3 vessel   with the following complications:  none.  Cord pH: na Infant with spontaneous respirations noted and excellent heart rate. Placed immediately on maternal abdomen for bonding and drying. Infant breastfed within first hour after birth. Anesthesia:  none Episiotomy:  none Lacerations:  small perineal laceration- superficial Suture Repair: NA Est. Blood Loss (mL):  50 ml  Mom to postpartum.  Baby to Couplet care / Skin to Skin.  Mirna Mires 08/11/2020, 12:54 PM

## 2020-06-14 ENCOUNTER — Encounter: Payer: 59 | Admitting: Obstetrics and Gynecology

## 2020-06-21 ENCOUNTER — Encounter: Payer: 59 | Admitting: Advanced Practice Midwife

## 2020-06-30 ENCOUNTER — Encounter: Payer: Self-pay | Admitting: Advanced Practice Midwife

## 2020-06-30 ENCOUNTER — Other Ambulatory Visit: Payer: Self-pay

## 2020-06-30 ENCOUNTER — Ambulatory Visit (INDEPENDENT_AMBULATORY_CARE_PROVIDER_SITE_OTHER): Payer: 59 | Admitting: Advanced Practice Midwife

## 2020-06-30 VITALS — BP 100/60 | Wt 162.0 lb

## 2020-06-30 DIAGNOSIS — Z720 Tobacco use: Secondary | ICD-10-CM | POA: Insufficient documentation

## 2020-06-30 DIAGNOSIS — Z3A33 33 weeks gestation of pregnancy: Secondary | ICD-10-CM

## 2020-06-30 DIAGNOSIS — O09529 Supervision of elderly multigravida, unspecified trimester: Secondary | ICD-10-CM

## 2020-06-30 DIAGNOSIS — O0993 Supervision of high risk pregnancy, unspecified, third trimester: Secondary | ICD-10-CM

## 2020-06-30 DIAGNOSIS — Z23 Encounter for immunization: Secondary | ICD-10-CM

## 2020-06-30 LAB — POCT URINALYSIS DIPSTICK OB
Glucose, UA: NEGATIVE
POC,PROTEIN,UA: NEGATIVE

## 2020-06-30 NOTE — Addendum Note (Signed)
Addended by: Cornelius Moras D on: 06/30/2020 11:33 AM   Modules accepted: Orders

## 2020-06-30 NOTE — Progress Notes (Signed)
Routine Prenatal Care Visit  Subjective  [name] [name] is a 35 y.o. [phone] at [redacted]w[redacted]d being seen today for ongoing prenatal care.  She is currently monitored for the following issues for this high-risk pregnancy and has Acute appendicitis with localized peritonitis; BI-RADS category 3 mammogram result; Antepartum multigravida of advanced maternal age; Supervision of high risk pregnancy, antepartum; Other atopic dermatitis; and Tobacco use on their problem list.  ----------------------------------------------------------------------------------- Patient reports some general discomforts of third trimester.   Contractions: Not present. Vag. Bleeding: None.  Movement: Present. Leaking Fluid denies.  ----------------------------------------------------------------------------------- The following portions of the patient's history were reviewed and updated as appropriate: allergies, current medications, past family history, past medical history, past social history, past surgical history and problem list. Problem list updated.  Objective  Blood pressure 100/60, weight 162 lb (73.5 kg), last menstrual period 11/08/2019. Pregravid weight 152 lb (68.9 kg) Total Weight Gain 10 lb (4.536 kg) Urinalysis: Urine Protein    Urine Glucose    Fetal Status: Fetal Heart Rate (bpm): 141 Fundal Height: 33 cm Movement: Present     General:  Alert, oriented and cooperative. Patient is in no acute distress.  Skin: Skin is warm and dry. No rash noted.   Cardiovascular: Normal heart rate noted  Respiratory: Normal respiratory effort, no problems with respiration noted  Abdomen: Soft, gravid, appropriate for gestational age. Pain/Pressure: Present     Pelvic:  Cervical exam deferred        Extremities: Normal range of motion.  Edema: None  Mental Status: Normal mood and affect. Normal behavior. Normal judgment and thought content.   Assessment   35 y.o. [id number] at [redacted]w[redacted]d by  08/14/2020, by Last Menstrual  Period presenting for routine prenatal visit  Plan   Pregnancy#4 Problems (from 11/08/19 to present)    Problem Noted Resolved   Supervision of high risk pregnancy, antepartum 01/26/2020 by Natale Milch, MD No   Overview Addendum 06/30/2020 11:21 AM by Tresea Mall, CNM     Nursing Staff Provider  Office Location  [facility] Dating   LMP = 11 wk [place]  Language  English Anatomy US   incomplete for heart views  Flu Vaccine  04/19/20 Genetic Screen  NIPS:  nml XX Negative Fragile-X, SMA, and CF    TDaP vaccine   06/30/20 Hgb A1C or  GTT Early : baseline HgbA1C 5.9 Early 3 hr 1 of 4 elevated Third trimester :   Rhogam   not needed   LAB RESULTS   Feeding Plan Breast Blood Type O/Positive/-- (08/09 1157)   Contraception Vasec Antibody Negative (08/09 1157)  Circumcision n/a Rubella 3.85 (08/09 1157)  Pediatrician   RPR Non Reactive (08/09 1157)   Support Person Husband HBsAg Negative (08/09 1157)   Prenatal Classes no HIV Non Reactive (08/09 1157)    Varicella  immune  BTL Consent n/a GBS  (For PCN allergy, check sensitivities)   Covid vacc done    VBAC Consent n/a Pap  2020 NIL         Previous Version   Antepartum multigravida of advanced maternal age 03/19/2020 by Vena Austria, MD No       Preterm labor symptoms and general obstetric precautions including but not limited to vaginal bleeding, contractions, leaking of fluid and fetal movement were reviewed in detail with the patient. Please refer to After Visit Summary for other counseling recommendations.   Return in about 2 weeks (around 07/14/2020) for rob.  Tresea Mall, CNM 06/30/2020 11:22 AM

## 2020-06-30 NOTE — Patient Instructions (Signed)

## 2020-07-14 ENCOUNTER — Ambulatory Visit (INDEPENDENT_AMBULATORY_CARE_PROVIDER_SITE_OTHER): Payer: 59 | Admitting: Obstetrics and Gynecology

## 2020-07-14 ENCOUNTER — Other Ambulatory Visit: Payer: Self-pay

## 2020-07-14 ENCOUNTER — Encounter: Payer: Self-pay | Admitting: Obstetrics and Gynecology

## 2020-07-14 VITALS — BP 100/70 | Ht 64.0 in | Wt 164.0 lb

## 2020-07-14 DIAGNOSIS — O09529 Supervision of elderly multigravida, unspecified trimester: Secondary | ICD-10-CM

## 2020-07-14 DIAGNOSIS — O0990 Supervision of high risk pregnancy, unspecified, unspecified trimester: Secondary | ICD-10-CM

## 2020-07-14 NOTE — Progress Notes (Signed)
Routine Prenatal Care Visit  Subjective  [name] [name] is a 35 y.o. [phone] at [redacted]w[redacted]d being seen today for ongoing prenatal care.  She is currently monitored for the following issues for this low-risk pregnancy and has Acute appendicitis with localized peritonitis; BI-RADS category 3 mammogram result; Antepartum multigravida of advanced maternal age; Supervision of high risk pregnancy, antepartum; Other atopic dermatitis; and Tobacco use on their problem list.  ----------------------------------------------------------------------------------- Patient reports no complaints.   Contractions: Not present. Vag. Bleeding: None.  Movement: Present. Denies leaking of fluid.  ----------------------------------------------------------------------------------- The following portions of the patient's history were reviewed and updated as appropriate: allergies, current medications, past family history, past medical history, past social history, past surgical history and problem list. Problem list updated.   Objective  Blood pressure 100/70, height 5\' 4"  (1.626 m), weight 164 lb (74.4 kg), last menstrual period 11/08/2019. Pregravid weight 152 lb (68.9 kg) Total Weight Gain 12 lb (5.443 kg) Urinalysis:      Fetal Status: Fetal Heart Rate (bpm): 150 Fundal Height: 35 cm Movement: Present     General:  Alert, oriented and cooperative. Patient is in no acute distress.  Skin: Skin is warm and dry. No rash noted.   Cardiovascular: Normal heart rate noted  Respiratory: Normal respiratory effort, no problems with respiration noted  Abdomen: Soft, gravid, appropriate for gestational age. Pain/Pressure: Present     Pelvic:  Cervical exam deferred        Extremities: Normal range of motion.  Edema: None  Mental Status: Normal mood and affect. Normal behavior. Normal judgment and thought content.     Assessment   35 y.o. 20 at [redacted]w[redacted]d by  08/14/2020, by Last Menstrual Period presenting for  routine prenatal visit  Plan   Pregnancy#4 Problems (from 11/08/19 to present)    Problem Noted Resolved   Supervision of high risk pregnancy, antepartum 01/26/2020 by 01/28/2020, MD No   Overview Addendum 07/14/2020 10:42 AM by 09/11/2020, MD     Nursing Staff Provider  Office Location  [facility] Dating   LMP = 11 wk Natale Milch  Language  English Anatomy US   complete  Flu Vaccine  04/19/20 Genetic Screen  NIPS:  nml XX Negative Fragile-X, SMA, and CF    TDaP vaccine   06/30/20 Hgb A1C or  GTT Early : baseline HgbA1C 5.9 Early 3 hr 1 of 4 elevated Third trimester : WNL  Rhogam   not needed   LAB RESULTS   Feeding Plan Breast Blood Type O/Positive/-- (08/09 1157)   Contraception Vasec Antibody Negative (08/09 1157)  Circumcision n/a Rubella 3.85 (08/09 1157)  Pediatrician   RPR Non Reactive (08/09 1157)   Support Person Husband HBsAg Negative (08/09 1157)   Prenatal Classes no HIV Non Reactive (08/09 1157)    Varicella  immune  BTL Consent n/a GBS  (For PCN allergy, check sensitivities)   Covid vacc done    VBAC Consent n/a Pap  2020 NIL         Previous Version   Antepartum multigravida of advanced maternal age 46/02/2020 by 03/19/2020, MD No         Gestational age appropriate obstetric precautions including but not limited to vaginal bleeding, contractions, leaking of fluid and fetal movement were reviewed in detail with the patient.    Return in about 1 week (around 07/21/2020) for ROB in person.  09/18/2020 MD [facility], [facility] 07/14/2020, 10:44 AM

## 2020-07-21 ENCOUNTER — Encounter: Payer: 59 | Admitting: Obstetrics and Gynecology

## 2020-07-21 ENCOUNTER — Encounter: Payer: 59 | Admitting: Obstetrics

## 2020-07-28 ENCOUNTER — Other Ambulatory Visit (HOSPITAL_COMMUNITY)
Admission: RE | Admit: 2020-07-28 | Discharge: 2020-07-28 | Disposition: A | Discharge status: Home / Self Care | Payer: 59 | Source: Ambulatory Visit | Attending: Advanced Practice Midwife | Admitting: Advanced Practice Midwife

## 2020-07-28 ENCOUNTER — Other Ambulatory Visit: Payer: Self-pay

## 2020-07-28 ENCOUNTER — Ambulatory Visit (INDEPENDENT_AMBULATORY_CARE_PROVIDER_SITE_OTHER): Payer: Self-pay | Admitting: Advanced Practice Midwife

## 2020-07-28 ENCOUNTER — Encounter: Payer: Self-pay | Admitting: Advanced Practice Midwife

## 2020-07-28 VITALS — BP 112/74 | Wt 165.0 lb

## 2020-07-28 DIAGNOSIS — O0993 Supervision of high risk pregnancy, unspecified, third trimester: Secondary | ICD-10-CM

## 2020-07-28 DIAGNOSIS — Z3685 Encounter for antenatal screening for Streptococcus B: Secondary | ICD-10-CM

## 2020-07-28 DIAGNOSIS — O09523 Supervision of elderly multigravida, third trimester: Secondary | ICD-10-CM | POA: Diagnosis not present

## 2020-07-28 DIAGNOSIS — Z369 Encounter for antenatal screening, unspecified: Secondary | ICD-10-CM | POA: Diagnosis present

## 2020-07-28 DIAGNOSIS — Z3A37 37 weeks gestation of pregnancy: Secondary | ICD-10-CM

## 2020-07-28 DIAGNOSIS — Z113 Encounter for screening for infections with a predominantly sexual mode of transmission: Secondary | ICD-10-CM | POA: Diagnosis not present

## 2020-07-28 DIAGNOSIS — O09529 Supervision of elderly multigravida, unspecified trimester: Secondary | ICD-10-CM

## 2020-07-28 DIAGNOSIS — O09893 Supervision of other high risk pregnancies, third trimester: Secondary | ICD-10-CM | POA: Insufficient documentation

## 2020-07-28 LAB — OB RESULTS CONSOLE GC/CHLAMYDIA: Gonorrhea: NEGATIVE

## 2020-07-28 NOTE — Patient Instructions (Signed)
Pain Relief During Labor and Delivery Many things can cause pain during labor and delivery, including:  Pressure due to the baby moving through the pelvis.  Stretching of tissues due to the baby moving through the birth canal.  Muscle tension due to anxiety or nervousness.  The uterus tightening (contracting)and relaxing to help move the baby. How do I get pain relief during labor and delivery? Discuss your pain relief options with your health care provider during your prenatal visits. Explore the options offered by your hospital or birth center. There are many ways to deal with the pain of labor and delivery. You can try relaxation techniques or doing relaxing activities, taking a warm shower or bath (hydrotherapy), or other methods. There are also many medicines available to help control pain. Relaxation techniques and activities Practice relaxation techniques or do relaxing activities, such as:  Focused breathing.  Meditation.  Visualization.  Aroma therapy.  Listening to your favorite music.  Hypnosis. Hydrotherapy Take a warm shower or bath. This may:  Provide comfort and relaxation.  Lessen your feeling of pain.  Reduce the amount of pain medicine needed.  Shorten the length of labor. Other methods Try doing other things, such as:  Getting a massage or having counterpressure on your back.  Applying warm packs or ice packs.  Changing positions often, moving around, or using a birthing ball. Medicines You may be given:  Pain medicine through an IV or an injection into a muscle.  Pain medicine inserted into your spinal column.  Injections of sterile water just under the skin on your lower back.  Nitrous oxide inhalation therapy, also called laughing gas.   What kinds of medicine are available for pain relief? There are two kinds of medicines that can be used to relieve pain during labor and delivery:  Analgesics. These medicines decrease pain without  causing you to lose feeling or the ability to move your muscles.  Anesthetics. These medicines block feeling in the body and can decrease your ability to move freely. Both kinds of medicine can cause minor side effects, such as nausea, trouble concentrating, and sleepiness. They can also affect the baby's heart rate before birth and his or her breathing after birth. For this reason, health care providers are careful about when and how much medicine is given. Which medicines are used to provide pain relief? Common medicines The most common medicines used to help manage pain during labor and delivery include:  Opioids. Opioids are medicines that decrease how much pain you feel (perception of pain). These medicines can be given through an IV or may be used with anesthetics to block pain.  Epidural analgesia. ? Epidural analgesia is given through a very thin tube that is inserted into the lower back. Medicine is delivered continuously to the area near your spinal column nerves (epidural space). After having this treatment, you may be able to move your legs, but you will not be able to walk. Depending on the amount and type of medicine given, you may lose all feeling in the lower half of your body, or you may have some sensation, including the urge to push. This treatment can be used to give pain relief for a vaginal birth. ? Sometimes, a numbing medicine is injected into the spinal fluid when an epidural catheter is placed. This provides for immediate relief but only lasts for 1-2 hours. Once it wears off, the epidural will provide pain relief. This is called a combined spinal-epidural (CSE) block.  Intrathecal analgesia (  spinal analgesia). Intrathecal analgesia is similar to epidural analgesia, but the medicine is injected into the spinal fluid instead of the epidural space. It is usually only given once. It starts to relieve pain quickly, but the pain relief lasts only 1-2 hours.  Pudendal block. This  block is done by injecting numbing medicine through the wall of the vagina and into a nerve in the pelvis. Other medicines Other medicines used to help manage pain during labor and delivery include:  Local anesthetics. These are used to numb a small area of the body. They may be used along with another kind of medicine or used to numb the nerves of the vagina, cervix, and perineum during the second stage of labor.  Spinal block (spinal anesthesia). Spinal anesthesia is similar to spinal analgesia, but the medicine that is used contains longer-acting numbing medicines and pain medicines. This type of anesthesia can be used for a cesarean delivery and allows you to stay awake for the birth of your baby.  General anesthetics cause you to lose consciousness so you do not feel pain. They are usually only used for an emergency cesarean delivery. These medicines are given through an IV or a mask or both. These medicines are used as part of a procedure or for an emergency delivery. Summary  Women have many options to help them manage the pain associated with labor and delivery.  You can try doing relaxing activities, taking a warm shower or bath, or other methods.  There are also many medicines available to help control pain during labor and delivery.  Talk with your health care provider about what options are available to you. This information is not intended to replace advice given to you by your health care provider. Make sure you discuss any questions you have with your health care provider. Document Revised: 04/15/2019 Document Reviewed: 04/15/2019 Elsevier Patient Education  2021 [employer] Vaginal Delivery  Vaginal delivery means that you give birth by pushing your baby out of your birth canal (vagina). A team of health care providers will help you before, during, and after vaginal delivery. Birth experiences are unique for every woman and every pregnancy, and birth experiences vary  depending on where you choose to give birth. What happens when I arrive at the birth center or hospital? Once you are in labor and have been admitted into the hospital or birth center, your health care provider may:  Review your pregnancy history and any concerns that you have.  Insert an IV into one of your veins. This may be used to give you fluids and medicines.  Check your blood pressure, pulse, temperature, and heart rate (vital signs).  Check whether your bag of water (amniotic sac) has broken (ruptured).  Talk with you about your birth plan and discuss pain control options. Monitoring Your health care provider may monitor your contractions (uterine monitoring) and your baby's heart rate (fetal monitoring). You may need to be monitored:  Often, but not continuously (intermittently).  All the time or for long periods at a time (continuously). Continuous monitoring may be needed if: ? You are taking certain medicines, such as medicine to relieve pain or make your contractions stronger. ? You have pregnancy or labor complications. Monitoring may be done by:  Placing a special stethoscope or a handheld monitoring device on your abdomen to check your baby's heartbeat and to check for contractions.  Placing monitors on your abdomen (external monitors) to record your baby's heartbeat and the frequency and length   of contractions.  Placing monitors inside your uterus through your vagina (internal monitors) to record your baby's heartbeat and the frequency, length, and strength of your contractions. Depending on the type of monitor, it may remain in your uterus or on your baby's head until birth.  Telemetry. This is a type of continuous monitoring that can be done with external or internal monitors. Instead of having to stay in bed, you are able to move around during telemetry. Physical exam Your health care provider may perform frequent physical exams. This may include:  Checking how  and where your baby is positioned in your uterus.  Checking your cervix to determine: ? Whether it is thinning out (effacing). ? Whether it is opening up (dilating). What happens during labor and delivery? Normal labor and delivery is divided into the following three stages: Stage 1  This is the longest stage of labor.  This stage can last for hours or days.  Throughout this stage, you will feel contractions. Contractions generally feel mild, infrequent, and irregular at first. They get stronger, more frequent (about every 2-3 minutes), and more regular as you move through this stage.  This stage ends when your cervix is completely dilated to 4 inches (10 cm) and completely effaced. Stage 2  This stage starts once your cervix is completely effaced and dilated and lasts until the delivery of your baby.  This stage may last from 20 minutes to 2 hours.  This is the stage where you will feel an urge to push your baby out of your vagina.  You may feel stretching and burning pain, especially when the widest part of your baby's head passes through the vaginal opening (crowning).  Once your baby is delivered, the umbilical cord will be clamped and cut. This usually occurs after waiting a period of 1-2 minutes after delivery.  Your baby will be placed on your bare chest (skin-to-skin contact) in an upright position and covered with a warm blanket. Watch your baby for feeding cues, like rooting or sucking, and help the baby to your breast for his or her first feeding. Stage 3  This stage starts immediately after the birth of your baby and ends after you deliver the placenta.  This stage may take anywhere from 5 to 30 minutes.  After your baby has been delivered, you will feel contractions as your body expels the placenta and your uterus contracts to control bleeding.   What can I expect after labor and delivery?  After labor is over, you and your baby will be monitored closely until you  are ready to go home to ensure that you are both healthy. Your health care team will teach you how to care for yourself and your baby.  You and your baby will stay in the same room (rooming in) during your hospital stay. This will encourage early bonding and successful breastfeeding.  You may continue to receive fluids and medicines through an IV.  Your uterus will be checked and massaged regularly (fundal massage).  You will have some soreness and pain in your abdomen, vagina, and the area of skin between your vaginal opening and your anus (perineum).  If an incision was made near your vagina (episiotomy) or if you had some vaginal tearing during delivery, cold compresses may be placed on your episiotomy or your tear. This helps to reduce pain and swelling.  You may be given a squirt bottle to use instead of wiping when you go to the bathroom. To use   the squirt bottle, follow these steps: ? Before you urinate, fill the squirt bottle with warm water. Do not use hot water. ? After you urinate, while you are sitting on the toilet, use the squirt bottle to rinse the area around your urethra and vaginal opening. This rinses away any urine and blood. ? Fill the squirt bottle with clean water every time you use the bathroom.  It is normal to have vaginal bleeding after delivery. Wear a sanitary pad for vaginal bleeding and discharge. Summary  Vaginal delivery means that you will give birth by pushing your baby out of your birth canal (vagina).  Your health care provider may monitor your contractions (uterine monitoring) and your baby's heart rate (fetal monitoring).  Your health care provider may perform a physical exam.  Normal labor and delivery is divided into three stages.  After labor is over, you and your baby will be monitored closely until you are ready to go home. This information is not intended to replace advice given to you by your health care provider. Make sure you discuss any  questions you have with your health care provider. Document Revised: 07/02/2017 Document Reviewed: 07/02/2017 Elsevier Patient Education  2021 [employer] Inc[employer]  

## 2020-07-28 NOTE — Progress Notes (Signed)
Routine Prenatal Care Visit  Subjective  [name] [name] is a 35 y.o. [phone] at [redacted]w[redacted]d being seen today for ongoing prenatal care.  She is currently monitored for the following issues for this high-risk pregnancy and has Acute appendicitis with localized peritonitis; BI-RADS category 3 mammogram result; Antepartum multigravida of advanced maternal age; Supervision of high risk pregnancy, antepartum; Other atopic dermatitis; and Tobacco use on their problem list.  ----------------------------------------------------------------------------------- Patient reports lower left abdominal pain especially when she has a contraction.   Contractions: Irregular. Vag. Bleeding: None.  Movement: Present. Leaking Fluid denies.  ----------------------------------------------------------------------------------- The following portions of the patient's history were reviewed and updated as appropriate: allergies, current medications, past family history, past medical history, past social history, past surgical history and problem list. Problem list updated.  Objective  Blood pressure 112/74, weight 165 lb (74.8 kg), last menstrual period 11/08/2019. Pregravid weight 152 lb (68.9 kg) Total Weight Gain 13 lb (5.897 kg) Urinalysis: Urine Protein    Urine Glucose    Fetal Status: Fetal Heart Rate (bpm): 134 Fundal Height: 37 cm Movement: Present  Presentation: Vertex  General:  Alert, oriented and cooperative. Patient is in no acute distress.  Skin: Skin is warm and dry. No rash noted.   Cardiovascular: Normal heart rate noted  Respiratory: Normal respiratory effort, no problems with respiration noted  Abdomen: Soft, gravid, appropriate for gestational age. Pain/Pressure: Present     Pelvic:  Cervical exam performed Dilation: 2 Effacement (%): 70 Station: -3, GBS/aptima collected  Extremities: Normal range of motion.  Edema: None  Mental Status: Normal mood and affect. Normal behavior. Normal judgment  and thought content.   Assessment   35 y.o. [id number] at [redacted]w[redacted]d by  08/14/2020, by Last Menstrual Period presenting for routine prenatal visit  Plan   Pregnancy#4 Problems (from 11/08/19 to present)    Problem Noted Resolved   Supervision of high risk pregnancy, antepartum 01/26/2020 by Natale Milch, MD No   Overview Addendum 07/14/2020 10:42 AM by Natale Milch, MD     Nursing Staff Provider  Office Location  [facility] Dating   LMP = 11 wk [place]  Language  English Anatomy US   complete  Flu Vaccine  04/19/20 Genetic Screen  NIPS:  nml XX Negative Fragile-X, SMA, and CF    TDaP vaccine   06/30/20 Hgb A1C or  GTT Early : baseline HgbA1C 5.9 Early 3 hr 1 of 4 elevated Third trimester : WNL  Rhogam   not needed   LAB RESULTS   Feeding Plan Breast Blood Type O/Positive/-- (08/09 1157)   Contraception Vasec Antibody Negative (08/09 1157)  Circumcision n/a Rubella 3.85 (08/09 1157)  Pediatrician   RPR Non Reactive (08/09 1157)   Support Person Husband HBsAg Negative (08/09 1157)   Prenatal Classes no HIV Non Reactive (08/09 1157)    Varicella  immune  BTL Consent n/a GBS  (For PCN allergy, check sensitivities)   Covid vacc done    VBAC Consent n/a Pap  2020 NIL         Previous Version   Antepartum multigravida of advanced maternal age 69/02/2020 by Vena Austria, MD No    GBS and aptima done today as she did not have an appointment last week   Preterm labor symptoms and general obstetric precautions including but not limited to vaginal bleeding, contractions, leaking of fluid and fetal movement were reviewed in detail with the patient. Please refer to After Visit Summary for other counseling recommendations.   Return  in about 1 week (around 08/04/2020) for rob.  Tresea Mall, CNM 07/28/2020 11:23 AM

## 2020-07-28 NOTE — Progress Notes (Signed)
No vb. No lof.  

## 2020-07-30 LAB — STREP GP B NAA: Strep Gp B NAA: NEGATIVE

## 2020-08-01 LAB — CERVICOVAGINAL ANCILLARY ONLY
Chlamydia: NEGATIVE
Comment: NEGATIVE
Comment: NEGATIVE
Comment: NORMAL
Neisseria Gonorrhea: NEGATIVE
Trichomonas: NEGATIVE

## 2020-08-04 ENCOUNTER — Ambulatory Visit (INDEPENDENT_AMBULATORY_CARE_PROVIDER_SITE_OTHER): Payer: BC Managed Care – PPO | Admitting: Obstetrics and Gynecology

## 2020-08-04 ENCOUNTER — Other Ambulatory Visit: Payer: Self-pay

## 2020-08-04 VITALS — BP 118/70 | Ht 64.0 in | Wt 162.8 lb

## 2020-08-04 DIAGNOSIS — O09529 Supervision of elderly multigravida, unspecified trimester: Secondary | ICD-10-CM

## 2020-08-04 DIAGNOSIS — O0993 Supervision of high risk pregnancy, unspecified, third trimester: Secondary | ICD-10-CM

## 2020-08-04 DIAGNOSIS — Z3A38 38 weeks gestation of pregnancy: Secondary | ICD-10-CM

## 2020-08-04 LAB — POCT URINALYSIS DIPSTICK OB
Glucose, UA: NEGATIVE
POC,PROTEIN,UA: NEGATIVE

## 2020-08-04 NOTE — Progress Notes (Signed)
Routine Prenatal Care Visit  Subjective  [name] [name] is a 35 y.o. [phone] at [redacted]w[redacted]d being seen today for ongoing prenatal care.  She is currently monitored for the following issues for this high-risk pregnancy and has Acute appendicitis with localized peritonitis; BI-RADS category 3 mammogram result; Antepartum multigravida of advanced maternal age; Supervision of high risk pregnancy, antepartum; Other atopic dermatitis; and Tobacco use on their problem list.  ----------------------------------------------------------------------------------- Patient reports no complaints.  Reports irregular contractions. Contractions: Irritability. Vag. Bleeding: None.  Movement: Present. Denies leaking of fluid.  ----------------------------------------------------------------------------------- The following portions of the patient's history were reviewed and updated as appropriate: allergies, current medications, past family history, past medical history, past social history, past surgical history and problem list. Problem list updated.   Objective  Blood pressure 118/70, height 5\' 4"  (1.626 m), weight 162 lb 12.8 oz (73.8 kg), last menstrual period 11/08/2019. Pregravid weight 152 lb (68.9 kg) Total Weight Gain 10 lb 12.8 oz (4.899 kg) Urinalysis:      Fetal Status: Fetal Heart Rate (bpm): 140 Fundal Height: 38 cm Movement: Present  Presentation: Vertex  General:  Alert, oriented and cooperative. Patient is in no acute distress.  Skin: Skin is warm and dry. No rash noted.   Cardiovascular: Normal heart rate noted  Respiratory: Normal respiratory effort, no problems with respiration noted  Abdomen: Soft, gravid, appropriate for gestational age. Pain/Pressure: Present     Pelvic:  Cervical exam deferred        Extremities: Normal range of motion.  Edema: None  ental Status: Normal mood and affect. Normal behavior. Normal judgment and thought content.     Assessment   35 y.o.  31 at [redacted]w[redacted]d by  08/14/2020, by Last Menstrual Period presenting for routine prenatal visit  Plan   Pregnancy#4 Problems (from 11/08/19 to present)    Problem Noted Resolved   Supervision of high risk pregnancy, antepartum 01/26/2020 by 01/28/2020, MD No   Overview Addendum 07/14/2020 10:42 AM by 09/11/2020, MD     Nursing Staff Provider  Office Location  [facility] Dating   LMP = 11 wk Natale Milch  Language  English Anatomy US   complete  Flu Vaccine  04/19/20 Genetic Screen  NIPS:  nml XX Negative Fragile-X, SMA, and CF    TDaP vaccine   06/30/20 Hgb A1C or  GTT Early : baseline HgbA1C 5.9 Early 3 hr 1 of 4 elevated Third trimester : WNL  Rhogam   not needed   LAB RESULTS   Feeding Plan Breast Blood Type O/Positive/-- (08/09 1157)   Contraception Vasec Antibody Negative (08/09 1157)  Circumcision n/a Rubella 3.85 (08/09 1157)  Pediatrician   RPR Non Reactive (08/09 1157)   Support Person Husband HBsAg Negative (08/09 1157)   Prenatal Classes no HIV Non Reactive (08/09 1157)    Varicella  immune  BTL Consent n/a GBS  (For PCN allergy, check sensitivities)   Covid vacc done    VBAC Consent n/a Pap  2020 NIL         Previous Version   Antepartum multigravida of advanced maternal age 49/02/2020 by 03/19/2020, MD No      -Discussed when to present to the hospital for labor evaluation -Reviewed GBS/aptima results with patient  Term labor precautions including but not limited to vaginal bleeding, contractions, leaking of fluid and fetal movement were reviewed in detail with the patient.    Return in about 1 week (around 08/11/2020) for ROB.  10/11/2020  Lyda Kalata CNM, MSN [facility], [facility] 08/04/2020, 10:57 AM

## 2020-08-11 ENCOUNTER — Inpatient Hospital Stay
Admission: EM | Admit: 2020-08-11 | Discharge: 2020-08-12 | DRG: 807 | Disposition: A | Discharge status: Home / Self Care | Payer: BC Managed Care – PPO | Attending: Obstetrics and Gynecology | Admitting: Obstetrics and Gynecology

## 2020-08-11 ENCOUNTER — Encounter: Payer: BC Managed Care – PPO | Admitting: Obstetrics and Gynecology

## 2020-08-11 ENCOUNTER — Other Ambulatory Visit: Payer: Self-pay

## 2020-08-11 ENCOUNTER — Encounter: Payer: Self-pay | Admitting: Obstetrics and Gynecology

## 2020-08-11 DIAGNOSIS — F17210 Nicotine dependence, cigarettes, uncomplicated: Secondary | ICD-10-CM | POA: Diagnosis present

## 2020-08-11 DIAGNOSIS — O99334 Smoking (tobacco) complicating childbirth: Secondary | ICD-10-CM | POA: Diagnosis present

## 2020-08-11 DIAGNOSIS — Z20822 Contact with and (suspected) exposure to covid-19: Secondary | ICD-10-CM | POA: Diagnosis present

## 2020-08-11 DIAGNOSIS — O0990 Supervision of high risk pregnancy, unspecified, unspecified trimester: Principal | ICD-10-CM

## 2020-08-11 DIAGNOSIS — Z3A39 39 weeks gestation of pregnancy: Secondary | ICD-10-CM | POA: Diagnosis not present

## 2020-08-11 DIAGNOSIS — O99324 Drug use complicating childbirth: Secondary | ICD-10-CM | POA: Diagnosis present

## 2020-08-11 DIAGNOSIS — F1290 Cannabis use, unspecified, uncomplicated: Secondary | ICD-10-CM | POA: Diagnosis present

## 2020-08-11 DIAGNOSIS — O09529 Supervision of elderly multigravida, unspecified trimester: Secondary | ICD-10-CM

## 2020-08-11 DIAGNOSIS — O26893 Other specified pregnancy related conditions, third trimester: Secondary | ICD-10-CM | POA: Diagnosis present

## 2020-08-11 LAB — CBC
HCT: 34.5 % — ABNORMAL LOW (ref 36.0–46.0)
Hemoglobin: 12 g/dL (ref 12.0–15.0)
MCH: 28.8 pg (ref 26.0–34.0)
MCHC: 34.8 g/dL (ref 30.0–36.0)
MCV: 82.9 fL (ref 80.0–100.0)
Platelets: 333 10*3/uL (ref 150–400)
RBC: 4.16 MIL/uL (ref 3.87–5.11)
RDW: 15.8 % — ABNORMAL HIGH (ref 11.5–15.5)
WBC: 13 10*3/uL — ABNORMAL HIGH (ref 4.0–10.5)
nRBC: 0 % (ref 0.0–0.2)

## 2020-08-11 LAB — RESP PANEL BY RT-PCR (FLU A&B, COVID) ARPGX2
Influenza A by PCR: NEGATIVE
Influenza B by PCR: NEGATIVE
SARS Coronavirus 2 by RT PCR: NEGATIVE

## 2020-08-11 LAB — URINE DRUG SCREEN, QUALITATIVE (ARMC ONLY)
Amphetamines, Ur Screen: NOT DETECTED
Barbiturates, Ur Screen: NOT DETECTED
Benzodiazepine, Ur Scrn: NOT DETECTED
Cannabinoid 50 Ng, Ur ~~LOC~~: POSITIVE — AB
Cocaine Metabolite,Ur ~~LOC~~: NOT DETECTED
MDMA (Ecstasy)Ur Screen: NOT DETECTED
Methadone Scn, Ur: NOT DETECTED
Opiate, Ur Screen: NOT DETECTED
Phencyclidine (PCP) Ur S: NOT DETECTED
Tricyclic, Ur Screen: NOT DETECTED

## 2020-08-11 LAB — TYPE AND SCREEN
ABO/RH(D): O POS
Antibody Screen: NEGATIVE

## 2020-08-11 MED ORDER — SODIUM CHLORIDE 0.9% FLUSH: 3.0000 mL | Freq: Two times a day (BID) | INTRAVENOUS | Status: DC

## 2020-08-11 MED ORDER — OXYCODONE-ACETAMINOPHEN 5-325 MG PO TABS: 2.0000 | ORAL_TABLET | ORAL | Status: DC | PRN

## 2020-08-11 MED ORDER — OXYTOCIN-SODIUM CHLORIDE 30-0.9 UT/500ML-% IV SOLN: 2.5000 [IU]/h | INTRAVENOUS | Status: DC

## 2020-08-11 MED ORDER — PRENATAL MULTIVITAMIN CH
1.0000 | ORAL_TABLET | Freq: Every day | ORAL | Status: DC
  Administered 2020-08-11 – 2020-08-12 (×2): 1 via ORAL
  Filled 2020-08-11 (×2): qty 1

## 2020-08-11 MED ORDER — SODIUM CHLORIDE 0.9 % IV SOLN: 250.0000 mL | INTRAVENOUS | Status: DC | PRN

## 2020-08-11 MED ORDER — ONDANSETRON HCL 4 MG PO TABS
4.0000 mg | ORAL_TABLET | ORAL | Status: DC | PRN
  Filled 2020-08-11: qty 1

## 2020-08-11 MED ORDER — IBUPROFEN 600 MG PO TABS
600.0000 mg | ORAL_TABLET | Freq: Four times a day (QID) | ORAL | Status: DC
  Administered 2020-08-11 – 2020-08-12 (×4): 600 mg via ORAL
  Filled 2020-08-11 (×4): qty 1

## 2020-08-11 MED ORDER — OXYCODONE HCL 5 MG PO TABS
5.0000 mg | ORAL_TABLET | ORAL | Status: DC | PRN
  Administered 2020-08-11 (×2): 5 mg via ORAL
  Filled 2020-08-11 (×2): qty 1

## 2020-08-11 MED ORDER — OXYTOCIN BOLUS FROM INFUSION: 333.0000 mL | Freq: Once | INTRAVENOUS | Status: DC

## 2020-08-11 MED ORDER — DIBUCAINE (PERIANAL) 1 % EX OINT
1.0000 "application " | TOPICAL_OINTMENT | CUTANEOUS | Status: DC | PRN
  Administered 2020-08-11: 1 via RECTAL
  Filled 2020-08-11: qty 28

## 2020-08-11 MED ORDER — DIPHENHYDRAMINE HCL 25 MG PO CAPS: 25.0000 mg | ORAL_CAPSULE | Freq: Four times a day (QID) | ORAL | Status: DC | PRN

## 2020-08-11 MED ORDER — SOD CITRATE-CITRIC ACID 500-334 MG/5ML PO SOLN: 30.0000 mL | ORAL | Status: DC | PRN

## 2020-08-11 MED ORDER — DOCUSATE SODIUM 100 MG PO CAPS
100.0000 mg | ORAL_CAPSULE | Freq: Two times a day (BID) | ORAL | Status: DC
  Administered 2020-08-12: 100 mg via ORAL
  Filled 2020-08-11: qty 1

## 2020-08-11 MED ORDER — TETANUS-DIPHTH-ACELL PERTUSSIS 5-2.5-18.5 LF-MCG/0.5 IM SUSY
0.5000 mL | PREFILLED_SYRINGE | Freq: Once | INTRAMUSCULAR | Status: DC
  Filled 2020-08-11: qty 0.5

## 2020-08-11 MED ORDER — LACTATED RINGERS IV SOLN: 500.0000 mL | INTRAVENOUS | Status: DC | PRN

## 2020-08-11 MED ORDER — ZOLPIDEM TARTRATE 5 MG PO TABS: 5.0000 mg | ORAL_TABLET | Freq: Every evening | ORAL | Status: DC | PRN

## 2020-08-11 MED ORDER — COCONUT OIL OIL
1.0000 "application " | TOPICAL_OIL | Status: DC | PRN
  Administered 2020-08-11: 1 via TOPICAL
  Filled 2020-08-11: qty 120

## 2020-08-11 MED ORDER — ONDANSETRON HCL 4 MG/2ML IJ SOLN: 4.0000 mg | INTRAMUSCULAR | Status: DC | PRN

## 2020-08-11 MED ORDER — BENZOCAINE-MENTHOL 20-0.5 % EX AERO
1.0000 "application " | INHALATION_SPRAY | CUTANEOUS | Status: DC | PRN
  Administered 2020-08-11: 1 via TOPICAL
  Filled 2020-08-11: qty 56

## 2020-08-11 MED ORDER — IBUPROFEN 600 MG PO TABS
600.0000 mg | ORAL_TABLET | Freq: Four times a day (QID) | ORAL | Status: DC
  Administered 2020-08-11: 600 mg via ORAL
  Filled 2020-08-11: qty 1

## 2020-08-11 MED ORDER — OXYCODONE-ACETAMINOPHEN 5-325 MG PO TABS: 1.0000 | ORAL_TABLET | ORAL | Status: DC | PRN

## 2020-08-11 MED ORDER — ACETAMINOPHEN 325 MG PO TABS
650.0000 mg | ORAL_TABLET | ORAL | Status: DC | PRN
  Administered 2020-08-11: 650 mg via ORAL
  Filled 2020-08-11: qty 2

## 2020-08-11 MED ORDER — ACETAMINOPHEN 325 MG PO TABS: 650.0000 mg | ORAL_TABLET | ORAL | Status: DC | PRN

## 2020-08-11 MED ORDER — ONDANSETRON HCL 4 MG/2ML IJ SOLN: 4.0000 mg | Freq: Four times a day (QID) | INTRAMUSCULAR | Status: DC | PRN

## 2020-08-11 MED ORDER — LIDOCAINE HCL (PF) 1 % IJ SOLN: 30.0000 mL | INTRAMUSCULAR | Status: DC | PRN

## 2020-08-11 MED ORDER — OXYTOCIN 10 UNIT/ML IJ SOLN
10.0000 [IU] | Freq: Once | INTRAMUSCULAR | Status: AC
  Administered 2020-08-11: 10 [IU] via INTRAMUSCULAR

## 2020-08-11 MED ORDER — SODIUM CHLORIDE 0.9% FLUSH: 3.0000 mL | INTRAVENOUS | Status: DC | PRN

## 2020-08-11 MED ORDER — OXYTOCIN 10 UNIT/ML IJ SOLN
INTRAMUSCULAR | Status: AC
  Filled 2020-08-11: qty 1

## 2020-08-11 MED ORDER — SIMETHICONE 80 MG PO CHEW: 80.0000 mg | CHEWABLE_TABLET | ORAL | Status: DC | PRN

## 2020-08-11 MED ORDER — WITCH HAZEL-GLYCERIN EX PADS
1.0000 "application " | MEDICATED_PAD | CUTANEOUS | Status: DC | PRN
  Administered 2020-08-11: 1 via TOPICAL
  Filled 2020-08-11: qty 100

## 2020-08-11 NOTE — Discharge Summary (Signed)
Physician Discharge Summary  Patient ID: [name] [name] MRN: [id number] DOB/AGE: [birth year] 35 y.o.  Admit date: 08/11/2020 Discharge date: 08/12/2020  Admission Diagnoses: Active labor at term  Discharge Diagnoses:  Active Problems:   Postpartum care following vaginal delivery   [redacted] weeks gestation of pregnancy   Normal labor and delivery   Discharged Condition: good  Hospital Course: Patient was admitted in active labor at 39 weeks 4 days gestation , with regular contractions and a reactive fetal heart strip. She progressed rapidly to complete dilation and delivered vaginally soon thereafter over a small perineal laceration that was left unrepaired. Her postpartum course was unremarkable, and she is discharged home in care of family. She is breastfeeding her newborn.  Consults: None  Significant Diagnostic Studies: none  Treatments: none  Discharge Exam: Blood pressure 119/78, pulse 71, temperature 97.7 F (36.5 C), temperature source Oral, resp. rate 18, height 5\' 4"  (1.626 m), weight 74.8 kg, last menstrual period 11/08/2019, SpO2 100 %, unknown if currently breastfeeding. General appearance: alert, cooperative and no distress Head: Normocephalic, without obvious abnormality, atraumatic Resp: clear to auscultation bilaterally Breasts: normal appearance, no masses or tenderness, nipples well everted. Observed nursing , well positioned, no latch pain Cardio: regular rate and rhythm, S1, S2 normal, no murmur, click, rub or gallop GI: soft, non-tender; bowel sounds normal; no masses,  no organomegaly and fundus is firm at u-1 Pelvic: external genitalia normal and perineum is without edema; lochia is scant rubra. Extremities: extremities normal, atraumatic, no cyanosis or edema, Homans sign is negative, no sign of DVT and no edema, redness or tenderness in the calves or thighs  Disposition: Discharge disposition: 01-Home or Self Care       Discharge Instructions     Activity as tolerated   Complete by: As directed    Call MD for:   Complete by: As directed    Call MD for:  difficulty breathing, headache or visual disturbances   Complete by: As directed    Call MD for:  extreme fatigue   Complete by: As directed    Call MD for:  hives   Complete by: As directed    Call MD for:  persistant dizziness or light-headedness   Complete by: As directed    Call MD for:  persistant nausea and vomiting   Complete by: As directed    Call MD for:  redness, tenderness, or signs of infection (pain, swelling, redness, odor or green/yellow discharge around incision site)   Complete by: As directed    Call MD for:  severe uncontrolled pain   Complete by: As directed    Call MD for:  temperature >100.4   Complete by: As directed    Diet - low sodium heart healthy   Complete by: As directed    Sexual activity   Complete by: As directed    Avoid intercourse for 6 weeks     Allergies as of 08/12/2020      Reactions   Latex Rash   (gloves and condoms)   Other Rash   chloraprep   Sulfa Antibiotics Hives, Rash   Childhood reaction      Medication List    TAKE these medications   acetaminophen 325 MG tablet Commonly known as: TYLENOL Take 650 mg by mouth every 6 (six) hours as needed for headache.   omeprazole 40 MG capsule Commonly known as: PRILOSEC Take 40 mg by mouth daily.   prenatal multivitamin Tabs tablet Take 1 tablet by mouth  daily at 12 noon.   triamcinolone 0.1 % Commonly known as: KENALOG Mix well 60 grams Kenalog with 7.5 ounces of lotion for sensitive skin and refrigerate. Apply 1 application topically 2 times a day for 2 weeks, followed by 1 application topically 1 time a day for 2 weeks, followed by 1 application topically every other day for 2 weeks       Follow-up Information    Mirna Mires, CNM. Schedule an appointment as soon as possible for a visit in 6 week(s).   Specialties: Obstetrics, Gynecology Why: husband plans  vasectomy.  Make appointment for a 6 week postpartum exam Contact information: [address] [place] [address] [phone]               Signed: Mirna Mires 08/12/2020, 11:00 AM

## 2020-08-11 NOTE — OB Triage Note (Signed)
Pt is a 35yo G4P2 at [redacted]w[redacted]d that presents with ctx. Pt states she has been having ctx for the last 4 days that became more intense this morning around 0500. Pt denies VB, LOF and states positive FM. Pt rating ctx pain 10/10 scale and breathing through them well. Pt is visibly uncomfortable. CNM notified of pt arrival to unit and cervical exam.

## 2020-08-11 NOTE — Lactation Note (Signed)
This note was copied from a baby's chart. Lactation Consultation Note  Patient Name: [name] [name] JSHFW'Y Date: 08/11/2020 Reason for consult: Initial assessment;Term Age:35 hours  Initial lactation visit. This is mom's third baby. She BF for 6 mo and 66mo with her other 2 children. She has larger breasts with good everted nipples. Mom is independent in feeding; LC observed position/latch- baby did well. Reviewed newborn stomach size, feeding patterns/behaviors, early cues, feeding with cues, and output expectations. Encourage 8-12 feeding attempts in first 24 hours. Reviewed hand expression, and encouraged hand expression if baby is sleepy. No concerns voiced. Encouraged to call out with questions/concerns. Whiteboard updated with LC name/number.  Maternal Data Has patient been taught Hand Expression?: Yes Does the patient have breastfeeding experience prior to this delivery?: Yes How long did the patient breastfeed?: 42mo & 3mo (now 21yr and 35yrs old)  Feeding Mother's Current Feeding Choice: Breast Milk  LATCH Score Latch: Grasps breast easily, tongue down, lips flanged, rhythmical sucking.  Audible Swallowing: Spontaneous and intermittent  Type of Nipple: Everted at rest and after stimulation  Comfort (Breast/Nipple): Soft / non-tender  Hold (Positioning): No assistance needed to correctly position infant at breast.  LATCH Score: 10   Lactation Tools Discussed/Used    Interventions Interventions: Breast feeding basics reviewed;Education  Discharge    Consult Status Consult Status: PRN    Danford Bad 08/11/2020, 3:22 PM

## 2020-08-11 NOTE — H&P (Signed)
[name] [name] is a 35 y.o. female presenting for active labor that started this morning at 0500. She denies SROM or active LOF.Marland KitchenAppears very uncomfortable. OB History    Gravida  4   Para  2   Term  2   Preterm      AB  1   Living  2     SAB      IAB      Ectopic  1   Multiple  0   Live Births  2          Past Medical History:  Diagnosis Date  . Family history of breast cancer   . Gestational diabetes   . History of peritonsillar abscess   . History of shingles    x6  . Migraines    none in 5 yrs  . Reflux   . Vertigo    while pregnant   Past Surgical History:  Procedure Laterality Date  . DILATION AND CURETTAGE OF UTERUS  2015  . LAPAROSCOPIC APPENDECTOMY N/A 03/30/2018   Procedure: APPENDECTOMY LAPAROSCOPIC;  Surgeon: Carolan Shiver, MD;  Location: [facility];  Service: General;  Laterality: N/A;  . LAPAROSCOPY    . TONSILLECTOMY Bilateral 08/12/2018   Procedure: TONSILLECTOMY;  Surgeon: Geanie Logan, MD;  Location: [facility];  Service: ENT;  Laterality: Bilateral;  Latex senstivity Upreg   Family History: family history includes Breast cancer (age of onset: 107) in her maternal aunt; Diabetes in her father, maternal grandfather, and paternal grandfather. Social History:  reports that she has been smoking. She has a 3.75 pack-year smoking history. She has never used smokeless tobacco. She reports previous alcohol use of about 8.0 standard drinks of alcohol per week. She reports current drug use. Drug: Marijuana.     Maternal Diabetes: No Genetic Screening: Normal Maternal Ultrasounds/Referrals: Normal Fetal Ultrasounds or other Referrals:  None Maternal Substance Abuse:  Yes:  Type: Marijuana Significant Maternal Medications:  None Significant Maternal Lab Results:  Group B Strep negative Other Comments:  None  Review of Systems  Constitutional: Negative.   HENT: Negative.   Eyes: Negative.   Respiratory: Negative.    Cardiovascular: Negative.   Gastrointestinal: Negative.        Gravid abdomen  Endocrine: Negative.   Genitourinary: Negative.   Musculoskeletal: Negative.   Allergic/Immunologic: Negative.   Neurological: Negative.   Hematological: Negative.   Psychiatric/Behavioral: Negative.    History Dilation: 5.5 Effacement (%): 90 Station: -1 Exam by:: Mindy RN Blood pressure 112/66, pulse 84, temperature 97.6 F (36.4 C), temperature source Oral, resp. rate 16, height 5\' 4"  (1.626 m), weight 74.8 kg, last menstrual period 11/08/2019. Maternal Exam:  Uterine Assessment: Contraction strength is moderate.  Contraction frequency is regular.   Abdomen: Fetal presentation: vertex  Introitus: Normal vulva. Ferning test: not done.  Nitrazine test: not done.  Pelvis: adequate for delivery.   Cervix: Cervix evaluated by digital exam.     Physical Exam Constitutional:      Appearance: Normal appearance.  HENT:     Head: Normocephalic and atraumatic.     Nose: Nose normal.  Cardiovascular:     Rate and Rhythm: Normal rate and regular rhythm.     Pulses: Normal pulses.     Heart sounds: Normal heart sounds.  Pulmonary:     Effort: Pulmonary effort is normal.     Breath sounds: Normal breath sounds.  Abdominal:     General: Bowel sounds are normal.     Comments:  Gravid, moderate strength contractions q 3-4 minutes.  Genitourinary:    General: Normal vulva.  Musculoskeletal:        General: Normal range of motion.     Cervical back: Normal range of motion and neck supple.  Skin:    General: Skin is warm and dry.  Neurological:     General: No focal deficit present.     Mental Status: She is alert and oriented to person, place, and time.  Psychiatric:        Mood and Affect: Mood normal.        Behavior: Behavior normal.     Prenatal labs: ABO, Rh: O/Positive/-- (08/09 1157) Antibody: Negative (08/09 1157) Rubella: 3.85 (08/09 1157) RPR: Non Reactive (12/07 1127)  HBsAg:  Negative (08/09 1157)  HIV: Non Reactive (12/07 1127)  GBS: Negative/-- (02/17 1119)   Assessment/Plan: IUP 39 weeks 4 days  In active labor. Reassuring, reactive FHTs  Routine Admission orders. Saline lock. Pt prefers unmedicated labor and birth-honor birth desires. UDS (hx of MJ uses this pregnancy)  Routine admission labs. Anticipate NSVD. Set up delivery table. Dr. Jean Rosenthal notified of patient's admission.  Mirna Mires, CNM  08/11/2020 11:39 AM    [name] [name] [name] [name] 08/11/2020, 11:27 AM

## 2020-08-12 LAB — CBC
HCT: 32.1 % — ABNORMAL LOW (ref 36.0–46.0)
Hemoglobin: 10.9 g/dL — ABNORMAL LOW (ref 12.0–15.0)
MCH: 28.8 pg (ref 26.0–34.0)
MCHC: 34 g/dL (ref 30.0–36.0)
MCV: 84.9 fL (ref 80.0–100.0)
Platelets: 299 10*3/uL (ref 150–400)
RBC: 3.78 MIL/uL — ABNORMAL LOW (ref 3.87–5.11)
RDW: 16 % — ABNORMAL HIGH (ref 11.5–15.5)
WBC: 14.3 10*3/uL — ABNORMAL HIGH (ref 4.0–10.5)
nRBC: 0 % (ref 0.0–0.2)

## 2020-08-12 LAB — RPR: RPR Ser Ql: NONREACTIVE

## 2020-08-12 NOTE — Lactation Note (Signed)
This note was copied from a baby's chart. Lactation Consultation Note  Patient Name: [name] [name] DVVOH'Y Date: 08/12/2020 Reason for consult: Follow-up assessment;Term Age:35 hours  Maternal Data Does the patient have breastfeeding experience prior to this delivery?: Yes How long did the patient breastfeed?: 9 mths  Feeding Mother's Current Feeding Choice: Breast Milk Baby currently feeding on left breast in cradle hold, has been fussy this am and pm with frequent feeds, observed good latch and audible swallows, baby sleepy at breast but wants to keep breast in mouth and nurses when stimulated, mom reports lots of gassiness today.   LATCH Score Latch: Grasps breast easily, tongue down, lips flanged, rhythmical sucking.  Audible Swallowing: Spontaneous and intermittent  Type of Nipple: Everted at rest and after stimulation  Comfort (Breast/Nipple): Soft / non-tender  Hold (Positioning): No assistance needed to correctly position infant at breast.  LATCH Score: 10   Lactation Tools Discussed/Used    Interventions  LC name and no written on white board, for d/c soon  Discharge [facility] Program: No  Consult Status Consult Status: Complete    Dyann Kief 08/12/2020, 2:20 PM

## 2020-08-12 NOTE — Progress Notes (Signed)
Mother discharged.  Discharge instructions given.  Mother verbalizes understanding.  Transported by auxiliary.  

## 2020-08-12 NOTE — Discharge Instructions (Signed)

## 2020-10-31 ENCOUNTER — Ambulatory Visit
Admission: EM | Admit: 2020-10-31 | Discharge: 2020-10-31 | Disposition: A | Discharge status: Home / Self Care | Payer: BC Managed Care – PPO

## 2020-10-31 ENCOUNTER — Other Ambulatory Visit: Payer: Self-pay

## 2020-10-31 DIAGNOSIS — H6693 Otitis media, unspecified, bilateral: Secondary | ICD-10-CM | POA: Diagnosis not present

## 2020-10-31 MED ORDER — AMOXICILLIN 875 MG PO TABS: 875.0000 mg | ORAL_TABLET | Freq: Two times a day (BID) | ORAL | 0 refills | Status: AC

## 2020-10-31 NOTE — Discharge Instructions (Signed)
Continue the amoxicillin as directed.  Take Tylenol or ibuprofen as needed for discomfort.  Follow-up with an ENT such as the one listed below.

## 2020-10-31 NOTE — ED Provider Notes (Signed)
[name] [name]    CSN: [id number] Arrival date & time: 10/31/20  1049      History   Chief Complaint Chief Complaint  Patient presents with  . Ear Fullness    HPI [name] [name] is a 35 y.o. female.   Patient presents with bilateral ear "fullness" and decreased hearing.  She denies current ear pain or other symptoms.  She had ear pain, runny nose, congestion, cough last week with the symptoms resolved.  Patient was seen at [facility] urgent care on 10/24/2020; treated with amoxicillin; she was negative for COVID, flu, strep, RSV at that time.  She is currently on day 5 of the amoxicillin and has 2 days left.  She is breast-feeding.     The history is provided by the patient and medical records.    Past Medical History:  Diagnosis Date  . Family history of breast cancer   . Gestational diabetes   . History of peritonsillar abscess   . History of shingles    x6  . Migraines    none in 5 yrs  . Reflux   . Vertigo    while pregnant    Patient Active Problem List   Diagnosis Date Noted  . [redacted] weeks gestation of pregnancy 08/11/2020  . Normal labor and delivery   . Tobacco use 06/30/2020  . Other atopic dermatitis 03/14/2020  . Supervision of high risk pregnancy, antepartum 01/26/2020  . BI-RADS category 3 mammogram result 01/18/2020  . Antepartum multigravida of advanced maternal age 64/02/2020  . Acute appendicitis with localized peritonitis 03/30/2018  . Postpartum care following vaginal delivery 03/25/2017    Past Surgical History:  Procedure Laterality Date  . DILATION AND CURETTAGE OF UTERUS  2015  . LAPAROSCOPIC APPENDECTOMY N/A 03/30/2018   Procedure: APPENDECTOMY LAPAROSCOPIC;  Surgeon: Carolan Shiver, MD;  Location: [facility] ORS;  Service: General;  Laterality: N/A;  . LAPAROSCOPY    . TONSILLECTOMY Bilateral 08/12/2018   Procedure: TONSILLECTOMY;  Surgeon: Geanie Logan, MD;  Location: [facility];  Service: ENT;   Laterality: Bilateral;  Latex senstivity Upreg    OB History    Gravida  4   Para  3   Term  3   Preterm      AB  1   Living  3     SAB      IAB      Ectopic  1   Multiple  0   Live Births  3            Home Medications    Prior to Admission medications   Medication Sig Start Date End Date Taking? Authorizing Provider  amoxicillin (AMOXIL) 875 MG tablet Take 1 tablet (875 mg total) by mouth 2 (two) times daily for 7 days. 10/31/20 11/07/20 Yes Mickie Bail, NP  acetaminophen (TYLENOL) 325 MG tablet Take 650 mg by mouth every 6 (six) hours as needed for headache.    [provider]  omeprazole (PRILOSEC) 40 MG capsule Take 40 mg by mouth daily.    [provider]  Prenatal Vit-Fe Fumarate-FA (PRENATAL MULTIVITAMIN) TABS tablet Take 1 tablet by mouth daily at 12 noon.    [provider]  triamcinolone cream (KENALOG) 0.1 % Mix well 60 grams Kenalog with 7.5 ounces of lotion for sensitive skin and refrigerate. Apply 1 application topically 2 times a day for 2 weeks, followed by 1 application topically 1 time a day for 2 weeks, followed by  1 application topically every other day for 2 weeks Patient not taking: No sig reported 03/14/20   Tresea Mall, CNM    Family History Family History  Problem Relation Age of Onset  . Diabetes Father   . Diabetes Maternal Grandfather   . Diabetes Paternal Grandfather   . Breast cancer Maternal Aunt 37    Social History Social History   Tobacco Use  . Smoking status: Current Every Day Smoker    Packs/day: 0.25    Years: 15.00    Pack years: 3.75  . Smokeless tobacco: Never Used  . Tobacco comment: trying to quit-smokes 3-5 cigarettes/day  Vaping Use  . Vaping Use: Never used  Substance Use Topics  . Alcohol use: Not Currently    Alcohol/week: 8.0 standard drinks    Types: 4 Cans of beer, 4 Shots of liquor per week  . Drug use: Yes    Types: Marijuana     Allergies   Latex, Other, and  Sulfa antibiotics   Review of Systems Review of Systems  Constitutional: Negative for chills and fever.  HENT: Positive for hearing loss. Negative for congestion, ear discharge, ear pain, rhinorrhea and sore throat.   Respiratory: Negative for cough and shortness of breath.   Cardiovascular: Negative for chest pain and palpitations.  Gastrointestinal: Negative for abdominal pain, diarrhea and vomiting.  Skin: Negative for color change and rash.  All other systems reviewed and are negative.    Physical Exam Triage Vital Signs ED Triage Vitals  Enc Vitals Group     BP      Pulse      Resp      Temp      Temp src      SpO2      Weight      Height      Head Circumference      Peak Flow      Pain Score      Pain Loc      Pain Edu?      Excl. in GC?    No data found.  Updated Vital Signs BP 123/73 (BP Location: Left Arm)   Pulse 66   Temp 97.7 F (36.5 C) (Temporal)   Resp 16   SpO2 98%   Breastfeeding Yes   Visual Acuity Right Eye Distance:   Left Eye Distance:   Bilateral Distance:    Right Eye Near:   Left Eye Near:    Bilateral Near:     Physical Exam Vitals and nursing note reviewed.  Constitutional:      General: She is not in acute distress.    Appearance: She is well-developed. She is not ill-appearing.  HENT:     Head: Normocephalic and atraumatic.     Right Ear: Ear canal normal. Tympanic membrane is erythematous.     Left Ear: Ear canal normal. Tympanic membrane is erythematous.     Ears:     Comments: TMs mildly erythematous.    Nose: Nose normal.     Mouth/Throat:     Mouth: Mucous membranes are moist.     Pharynx: Oropharynx is clear.  Eyes:     Conjunctiva/sclera: Conjunctivae normal.  Cardiovascular:     Rate and Rhythm: Normal rate and regular rhythm.     Heart sounds: Normal heart sounds.  Pulmonary:     Effort: Pulmonary effort is normal. No respiratory distress.     Breath sounds: Normal breath sounds.  Abdominal:  Palpations: Abdomen is soft.     Tenderness: There is no abdominal tenderness.  Musculoskeletal:     Cervical back: Neck supple.  Skin:    General: Skin is warm and dry.     Findings: No rash.  Neurological:     General: No focal deficit present.     Mental Status: She is alert and oriented to person, place, and time.     Gait: Gait normal.  Psychiatric:        Mood and Affect: Mood normal.        Behavior: Behavior normal.      UC Treatments / Results  Labs (all labs ordered are listed, but only abnormal results are displayed) Labs Reviewed - No data to display  EKG   Radiology No results found.  Procedures Procedures (including critical care time)  Medications Ordered in UC Medications - No data to display  Initial Impression / Assessment and Plan / UC Course  I have reviewed the triage vital signs and the nursing notes.  Pertinent labs & imaging results that were available during my care of the patient were reviewed by me and considered in my medical decision making (see chart for details).   Bilateral otitis media.  Patient is breast-feeding.  Extending amoxicillin for total of 14 days since her symptoms have improved on this medication.  Instructed her to take Tylenol or ibuprofen as needed for discomfort.  Instructed her to follow-up with her PCP or an ENT if her symptoms or not improving.  She agrees to plan of care.   Final Clinical Impressions(s) / UC Diagnoses   Final diagnoses:  Bilateral otitis media, unspecified otitis media type     Discharge Instructions     Continue the amoxicillin as directed.  Take Tylenol or ibuprofen as needed for discomfort.  Follow-up with an ENT such as the one listed below.        ED Prescriptions    Medication Sig Dispense Auth. Provider   amoxicillin (AMOXIL) 875 MG tablet Take 1 tablet (875 mg total) by mouth 2 (two) times daily for 7 days. 14 tablet Mickie Bail, NP     PDMP not reviewed this encounter.    Mickie Bail, NP 10/31/20 [phone]

## 2020-10-31 NOTE — ED Triage Notes (Signed)
Patient presents to Urgent Care with complaints of bilateral ear fullness. She states she was seen last week for cold like symptoms and prescribed Amoxicillan. She states ear fullness has worsened. Pt was tested for covid, flu, strep, and RSV all test results were negative .   Denies fever.

## 2021-03-20 ENCOUNTER — Other Ambulatory Visit: Payer: Self-pay | Admitting: Obstetrics and Gynecology

## 2021-03-20 DIAGNOSIS — N6320 Unspecified lump in the left breast, unspecified quadrant: Secondary | ICD-10-CM

## 2021-03-20 DIAGNOSIS — R928 Other abnormal and inconclusive findings on diagnostic imaging of breast: Secondary | ICD-10-CM

## 2021-07-11 ENCOUNTER — Other Ambulatory Visit: Payer: Self-pay | Admitting: Obstetrics and Gynecology

## 2021-07-11 DIAGNOSIS — R928 Other abnormal and inconclusive findings on diagnostic imaging of breast: Secondary | ICD-10-CM

## 2021-07-11 DIAGNOSIS — N6320 Unspecified lump in the left breast, unspecified quadrant: Secondary | ICD-10-CM

## 2021-07-11 NOTE — Progress Notes (Signed)
F/u dx mammo and u/s LT breast orders

## 2021-07-21 ENCOUNTER — Ambulatory Visit
Admission: RE | Admit: 2021-07-21 | Discharge: 2021-07-21 | Disposition: A | Discharge status: Home / Self Care | Payer: BC Managed Care – PPO | Source: Ambulatory Visit | Attending: Obstetrics and Gynecology | Admitting: Obstetrics and Gynecology

## 2021-07-21 ENCOUNTER — Other Ambulatory Visit: Payer: Self-pay

## 2021-07-21 DIAGNOSIS — N6320 Unspecified lump in the left breast, unspecified quadrant: Secondary | ICD-10-CM | POA: Diagnosis present

## 2021-07-21 DIAGNOSIS — R928 Other abnormal and inconclusive findings on diagnostic imaging of breast: Secondary | ICD-10-CM | POA: Insufficient documentation

## 2021-07-22 ENCOUNTER — Encounter: Payer: Self-pay | Admitting: Obstetrics and Gynecology

## 2021-08-09 ENCOUNTER — Other Ambulatory Visit: Payer: Self-pay | Admitting: Obstetrics and Gynecology

## 2022-12-25 ENCOUNTER — Ambulatory Visit
Admission: RE | Admit: 2022-12-25 | Discharge: 2022-12-25 | Disposition: A | Discharge status: Home / Self Care | Payer: BC Managed Care – PPO | Source: Ambulatory Visit | Attending: Emergency Medicine | Admitting: Emergency Medicine

## 2022-12-25 VITALS — BP 109/84 | HR 75 | Temp 97.8°F | Resp 16

## 2022-12-25 DIAGNOSIS — J029 Acute pharyngitis, unspecified: Secondary | ICD-10-CM | POA: Insufficient documentation

## 2022-12-25 DIAGNOSIS — Z20822 Contact with and (suspected) exposure to covid-19: Secondary | ICD-10-CM | POA: Insufficient documentation

## 2022-12-25 DIAGNOSIS — J069 Acute upper respiratory infection, unspecified: Secondary | ICD-10-CM | POA: Insufficient documentation

## 2022-12-25 MED ORDER — IBUPROFEN 600 MG PO TABS: 600.0000 mg | ORAL_TABLET | Freq: Three times a day (TID) | ORAL | 0 refills | Status: AC | PRN

## 2022-12-25 MED ORDER — AMOXICILLIN-POT CLAVULANATE 875-125 MG PO TABS: 1.0000 | ORAL_TABLET | Freq: Two times a day (BID) | ORAL | 0 refills | Status: AC

## 2022-12-25 MED ORDER — IPRATROPIUM BROMIDE 0.06 % NA SOLN: 2.0000 | Freq: Four times a day (QID) | NASAL | 0 refills | Status: AC

## 2022-12-25 NOTE — Discharge Instructions (Addendum)
your rapid strep was negative today, so we have sent off a throat culture.  Give [place] a working phone number.  If you were given a prescription for antibiotics, you may want to wait and fill it until you know the results of the culture.  I have also sent off a COVID test.  You do not need to fill the antibiotics if COVID is positive.  1 gram of Tylenol and 600 mg ibuprofen together 3-4 times a day as needed for pain.  Make sure you drink plenty of extra fluids.  Some people find salt water gargles and  Traditional Medicinal's "Throat Coat" tea helpful. Take 5 mL of liquid Benadryl and 5 mL of Maalox. Mix it together, and then hold it in your mouth for as long as you can and then swallow. You may do this 4 times a day.  Honey and lemon dissolved in hot water can also be soothing.  Atrovent nasal spray, saline nasal irrigation with a NeilMed sinus rinse and distilled water as often as you want, Mucinex D.  Discontinue other cold/flu medications.  Here is a list of primary care providers who are taking new patients:  [facility] [place] Dr. Joseph Berkshire (sports medicine) Dr. Elizabeth Sauer [address] [place] [address] [phone]  [facility] at [facility] [address] [place], [place] [address] [phone]  [facility] [place] [address]  [place] [address]  [phone]  [facility] [address] [place], [place] [address] [phone]  [facility] [address] [place]  [phone] [place], [place] [address]  Here are clinics/ other resources who will see you if you do not have insurance. Some have certain criteria that you must meet. Call them and find out what they are:  [facility]: [address], [place], [place] [address] Phone: [phone] Hours: First and Third Saturdays of each Month, 9 a.m. - 1 p.m.  [facility]: [address], [address] [place], [place] [address] Phone: [phone] Hours: Tuesday, 4 p.m. - 8  p.m. Thursday, 1 p.m. - 8 p.m. Wednesday, 9 a.m. - [facility] [address] [place], [place] [address] Phone: [phone] Pharmacy Phone Number: [phone] Dental Phone Number: [phone] [facility] Insurance Help: [phone]  Dental Hours: Monday - Thursday, 8 a.m. - 6 p.m.  [facility] [address], [place], [place] [address] Phone: [phone] Pharmacy Phone Number: [phone] [facility] Insurance Help: [phone]  [facility] [address] [place], [place] [address] Phone: [phone] Pharmacy Phone Number: [phone] [facility] Insurance Help: [phone]  [facility] [address] [place], [place] [address] Phone: [phone] [facility] Insurance Help: [phone]   [facility] [address], [place], [place] [address] Phone: [phone]  Go to www.goodrx.com  or www.costplusdrugs.com to look up your medications. This will give you a list of where you can find your prescriptions at the most affordable prices. Or ask the pharmacist what the cash price is, or if they have any other discount programs available to help make your medication more affordable. This can be less expensive than what you would pay with insurance.

## 2022-12-25 NOTE — ED Triage Notes (Signed)
Patient presents to UC for fever, chills, and sore throat x 3 days. Treating with OTC cold meds and Tylenol.

## 2022-12-25 NOTE — ED Provider Notes (Signed)
HPI  SUBJECTIVE:  [name] [name] is a 37 y.o. female who presents with fevers Tmax 101.5 for 5 days, sore throat, sore neck, nasal congestion, clear rhinorrhea, body aches, chills, headaches, postnasal drip, bilateral ear pain.  States that she sees white patches on the back of her throat.  No change in hearing, otorrhea, dizziness, tinnitus.  No sinus pain or pressure, facial swelling, or dental pain.  No coughing, wheezing or shortness of breath, nausea, vomiting, diarrhea, abdominal pain, rash.  No drooling, trismus, sensation of throat swelling shut, difficulty breathing, voice changes.  No antipyretic in the past 6 hours.  No antibiotics in the past month.  No known COVID, flu, strep, mono exposure.  She got 1 dose of the COVID-vaccine.  She has been taking over-the-counter cold and flu medications, ibuprofen, Tylenol, salt water gargles, rest and fluids without improvement in her symptoms.  Symptoms worse with swallowing.  She is status post tonsillectomy, has a history of peritonsillar abscess, gestational diabetes.  LMP: Now.  Denies possibility being pregnant.  She is breast-feeding her 51-year-old, but is weaning her off.  PCP: None.    Past Medical History:  Diagnosis Date   Family history of breast cancer    Gestational diabetes    History of peritonsillar abscess    History of shingles    x6   Migraines    none in 5 yrs   Reflux    Vertigo    while pregnant    Past Surgical History:  Procedure Laterality Date   DILATION AND CURETTAGE OF UTERUS  2015   LAPAROSCOPIC APPENDECTOMY N/A 03/30/2018   Procedure: APPENDECTOMY LAPAROSCOPIC;  Surgeon: Carolan Shiver, MD;  Location: [facility] OR[facility];  Service: General;  Laterality: N/A;   LAPAROSCOPY     TONSILLECTOMY Bilateral 08/12/2018   Procedure: TONSILLECTOMY;  Surgeon: Geanie Logan, MD;  Location: [facility];  Service: ENT;  Laterality: Bilateral;  Latex senstivity Upreg    Family History  Problem Relation  Age of Onset   Diabetes Father    Diabetes Maternal Grandfather    Diabetes Paternal Grandfather    Breast cancer Maternal Aunt 70    Social History   Tobacco Use   Smoking status: Every Day    Current packs/day: 0.25    Average packs/day: 0.3 packs/day for 15.0 years (3.8 ttl pk-yrs)    Types: Cigarettes   Smokeless tobacco: Never   Tobacco comments:    trying to quit-smokes 3-5 cigarettes/day  Vaping Use   Vaping status: Never Used  Substance Use Topics   Alcohol use: Not Currently    Alcohol/week: 8.0 standard drinks of alcohol    Types: 4 Cans of beer, 4 Shots of liquor per week   Drug use: Yes    Types: Marijuana    No current facility-administered medications for this encounter.  Current Outpatient Medications:    amoxicillin-clavulanate (AUGMENTIN) 875-125 MG tablet, Take 1 tablet by mouth every 12 (twelve) hours for 10 days., Disp: 20 tablet, Rfl: 0   ibuprofen (ADVIL) 600 MG tablet, Take 1 tablet (600 mg total) by mouth every 8 (eight) hours as needed., Disp: 30 tablet, Rfl: 0   ipratropium (ATROVENT) 0.06 % nasal spray, Place 2 sprays into both nostrils 4 (four) times daily., Disp: 15 mL, Rfl: 0   omeprazole (PRILOSEC) 40 MG capsule, Take 40 mg by mouth daily., Disp: , Rfl:    Prenatal Vit-Fe Fumarate-FA (PRENATAL MULTIVITAMIN) TABS tablet, Take 1 tablet by mouth daily at 12 noon., Disp: ,  Rfl:   Allergies  Allergen Reactions   Latex Rash    (gloves and condoms)   Other Rash    chloraprep   Sulfa Antibiotics Hives and Rash    Childhood reaction     ROS  As noted in HPI.   Physical Exam  BP 109/84 (BP Location: Left Arm)   Pulse 75   Temp 97.8 F (36.6 C) (Temporal)   Resp 16   LMP 12/20/2022 (Approximate)   SpO2 98%   Breastfeeding Yes   Constitutional: Well developed, well nourished, no acute distress Eyes:  EOMI, conjunctiva normal bilaterally HENT: Normocephalic, atraumatic,mucus membranes moist.  Positive clear nasal congestion.   Erythematous, swollen turbinates.  No maxillary, frontal sinus tenderness.  TMs normal bilaterally.  Erythematous oropharynx.  Tonsils surgically absent.  No plaques or patches.  Uvula midline. Neck: Positive anterior, posterior cervical lymphadenopathy Respiratory: Normal inspiratory effort, lungs clear bilaterally Cardiovascular: Normal rate, regular rhythm, no murmurs rubs or gallops GI: nondistended, soft, nontender, no splenomegaly skin: No rash, skin intact Musculoskeletal: no deformities Neurologic: Alert & oriented x 3, no focal neuro deficits Psychiatric: Speech and behavior appropriate   ED Course   Medications - No data to display  Orders Placed This Encounter  Procedures   SARS CORONAVIRUS 2 (TAT 6-24 HRS) Anterior Nasal Swab    Standing Status:   Standing    Number of Occurrences:   1   Culture, group A strep    Standing Status:   Standing    Number of Occurrences:   1   POCT rapid strep A    Standing Status:   Standing    Number of Occurrences:   1    No results found for this or any previous visit (from the past 24 hour(s)). No results found.  ED Clinical Impression  1. Acute pharyngitis, unspecified etiology   2. Encounter for laboratory testing for COVID-19 virus   3. Acute upper respiratory infection      ED Assessment/Plan     Strep negative.  Will send throat culture off.  However, will send home with a wait-and-see prescription of Augmentin today as I am concerned that she has strep pharyngitis.  Using Augmentin as I am concerned that she may develop a sinusitis.  She has no evidence of an otitis media.  Her center score is 3 out of 4 only because she is status post tonsillectomy.  Will also check a COVID.  Patient does not want antivirals if COVID is positive.  In the meantime, Tylenol/ibuprofen, Atrovent nasal spray, saline nasal irrigation, Benadryl/Maalox mixture Mucinex D.  Discontinue other cold/flu medications.  Provide primary care list for  routine care.  ER return precautions given.   Discussed labs, MDM, treatment plan, and plan for follow-up with patient. Discussed sn/sx that should prompt return to the ED. patient agrees with plan.   Meds ordered this encounter  Medications   amoxicillin-clavulanate (AUGMENTIN) 875-125 MG tablet    Sig: Take 1 tablet by mouth every 12 (twelve) hours for 10 days.    Dispense:  20 tablet    Refill:  0   ipratropium (ATROVENT) 0.06 % nasal spray    Sig: Place 2 sprays into both nostrils 4 (four) times daily.    Dispense:  15 mL    Refill:  0   ibuprofen (ADVIL) 600 MG tablet    Sig: Take 1 tablet (600 mg total) by mouth every 8 (eight) hours as needed.    Dispense:  30 tablet  Refill:  0      *This clinic note was created using Scientist, clinical (histocompatibility and immunogenetics). Therefore, there may be occasional mistakes despite careful proofreading.  ?    Domenick Gong, MD 12/25/22 1719

## 2022-12-26 LAB — SARS CORONAVIRUS 2 (TAT 6-24 HRS): SARS Coronavirus 2: NEGATIVE

## 2023-09-14 ENCOUNTER — Ambulatory Visit
Admission: EM | Admit: 2023-09-14 | Discharge: 2023-09-14 | Disposition: A | Discharge status: Home / Self Care | Attending: Emergency Medicine | Admitting: Emergency Medicine

## 2023-09-14 DIAGNOSIS — H6692 Otitis media, unspecified, left ear: Secondary | ICD-10-CM | POA: Diagnosis not present

## 2023-09-14 LAB — POCT RAPID STREP A (OFFICE): Rapid Strep A Screen: NEGATIVE

## 2023-09-14 MED ORDER — AMOXICILLIN 875 MG PO TABS: 875.0000 mg | ORAL_TABLET | Freq: Two times a day (BID) | ORAL | 0 refills | Status: AC

## 2023-09-14 NOTE — ED Provider Notes (Signed)
 Renaldo Fiddler    CSN: [id number] Arrival date & time: 09/14/23  7829      History   Chief Complaint Chief Complaint  Patient presents with   Otalgia   Sore Throat    HPI Anna-Marie Coller Val Eagle is a 38 y.o. female.  Patient presents with 1 week history of left ear pain and sore throat.  No recent fever.  No ear drainage, cough, shortness of breath.  Treating symptoms with ibuprofen.  The history is provided by the patient and medical records.    Past Medical History:  Diagnosis Date   Family history of breast cancer    Gestational diabetes    History of peritonsillar abscess    History of shingles    x6   Migraines    none in 5 yrs   Reflux    Vertigo    while pregnant    Patient Active Problem List   Diagnosis Date Noted   [redacted] weeks gestation of pregnancy 08/11/2020   Normal labor and delivery    Tobacco use 06/30/2020   Other atopic dermatitis 03/14/2020   Supervision of high risk pregnancy, antepartum 01/26/2020   BI-RADS category 3 mammogram result 01/18/2020   Antepartum multigravida of advanced maternal age 09/18/2019   Acute appendicitis with localized peritonitis 03/30/2018   Postpartum care following vaginal delivery 03/25/2017    Past Surgical History:  Procedure Laterality Date   DILATION AND CURETTAGE OF UTERUS  2015   LAPAROSCOPIC APPENDECTOMY N/A 03/30/2018   Procedure: APPENDECTOMY LAPAROSCOPIC;  Surgeon: Carolan Shiver, MD;  Location: [facility];  Service: General;  Laterality: N/A;   LAPAROSCOPY     TONSILLECTOMY Bilateral 08/12/2018   Procedure: TONSILLECTOMY;  Surgeon: Geanie Logan, MD;  Location: [facility];  Service: ENT;  Laterality: Bilateral;  Latex senstivity Upreg    OB History     Gravida  4   Para  3   Term  3   Preterm      AB  1   Living  3      SAB      IAB      Ectopic  1   Multiple  0   Live Births  3            Home Medications    Prior to Admission medications    Medication Sig Start Date End Date Taking? Authorizing Provider  amoxicillin (AMOXIL) 875 MG tablet Take 1 tablet (875 mg total) by mouth 2 (two) times daily for 10 days. 09/14/23 09/24/23 Yes Mickie Bail, NP  ibuprofen (ADVIL) 600 MG tablet Take 1 tablet (600 mg total) by mouth every 8 (eight) hours as needed. 12/25/22   Domenick Gong, MD  ipratropium (ATROVENT) 0.06 % nasal spray Place 2 sprays into both nostrils 4 (four) times daily. 12/25/22   Domenick Gong, MD  omeprazole (PRILOSEC) 40 MG capsule Take 40 mg by mouth daily.    [provider]  Prenatal Vit-Fe Fumarate-FA (PRENATAL MULTIVITAMIN) TABS tablet Take 1 tablet by mouth daily at 12 noon.    [provider]    Family History Family History  Problem Relation Age of Onset   Diabetes Father    Diabetes Maternal Grandfather    Diabetes Paternal Grandfather    Breast cancer Maternal Aunt 100    Social History Social History   Tobacco Use   Smoking status: Every Day    Current packs/day: 0.25    Average packs/day: 0.3 packs/day for 15.0 years (  3.8 ttl pk-yrs)    Types: Cigarettes   Smokeless tobacco: Never   Tobacco comments:    trying to quit-smokes 3-5 cigarettes/day  Vaping Use   Vaping status: Never Used  Substance Use Topics   Alcohol use: Not Currently    Alcohol/week: 8.0 standard drinks of alcohol    Types: 4 Cans of beer, 4 Shots of liquor per week   Drug use: Yes    Types: Marijuana     Allergies   Latex, Other, and Sulfa antibiotics   Review of Systems Review of Systems  Constitutional:  Negative for chills and fever.  HENT:  Positive for ear pain and sore throat. Negative for ear discharge.   Respiratory:  Negative for shortness of breath.      Physical Exam Triage Vital Signs ED Triage Vitals [09/14/23 1013]  Encounter Vitals Group     BP 115/77     Systolic BP Percentile      Diastolic BP Percentile      Pulse Rate 64     Resp 18     Temp 97.8 F (36.6 C)      Temp src      SpO2 97 %     Weight      Height      Head Circumference      Peak Flow      Pain Score      Pain Loc      Pain Education      Exclude from Growth Chart    No data found.  Updated Vital Signs BP 115/77   Pulse 64   Temp 97.8 F (36.6 C)   Resp 18   LMP 09/07/2023   SpO2 97%   Breastfeeding Yes   Visual Acuity Right Eye Distance:   Left Eye Distance:   Bilateral Distance:    Right Eye Near:   Left Eye Near:    Bilateral Near:     Physical Exam Constitutional:      General: She is not in acute distress. HENT:     Right Ear: Tympanic membrane normal.     Left Ear: Tympanic membrane is erythematous.     Nose: Nose normal.     Mouth/Throat:     Mouth: Mucous membranes are moist.     Pharynx: Oropharynx is clear.  Cardiovascular:     Rate and Rhythm: Normal rate and regular rhythm.     Heart sounds: Normal heart sounds.  Pulmonary:     Effort: Pulmonary effort is normal. No respiratory distress.     Breath sounds: Normal breath sounds.  Neurological:     Mental Status: She is alert.      UC Treatments / Results  Labs (all labs ordered are listed, but only abnormal results are displayed) Labs Reviewed  POCT RAPID STREP A (OFFICE)    EKG   Radiology No results found.  Procedures Procedures (including critical care time)  Medications Ordered in UC Medications - No data to display  Initial Impression / Assessment and Plan / UC Course  I have reviewed the triage vital signs and the nursing notes.  Pertinent labs & imaging results that were available during my care of the patient were reviewed by me and considered in my medical decision making (see chart for details).    Left otitis media.  Afebrile and vital signs are stable.  Rapid strep negative.  Left TM erythematous.  Treating with amoxicillin.  Tylenol or ibuprofen as needed.  Instructed  patient to follow-up with her PCP if she is not improving.  She agrees to plan of  care.  Final Clinical Impressions(s) / UC Diagnoses   Final diagnoses:  Left otitis media, unspecified otitis media type     Discharge Instructions      Take the amoxicillin as directed for ear infection.    The strep test is negative.    Follow-up with your primary care provider if your symptoms are not improving.      ED Prescriptions     Medication Sig Dispense Auth. Provider   amoxicillin (AMOXIL) 875 MG tablet Take 1 tablet (875 mg total) by mouth 2 (two) times daily for 10 days. 20 tablet Mickie Bail, NP      PDMP not reviewed this encounter.   Mickie Bail, NP 09/14/23 1046

## 2023-09-14 NOTE — ED Triage Notes (Signed)
 Patient to Urgent Care with complaints of left sided ear pain/ sore throat. Reports Fever at the beginning of illness- no fever for approx 4 days.   Symptoms started one week ago. Other children at home sick w/ same symptoms (oldest daughter had the flu).   Taking ibuprofen.

## 2023-09-14 NOTE — Discharge Instructions (Addendum)
 Take the amoxicillin as directed for ear infection.    The strep test is negative.    Follow-up with your primary care provider if your symptoms are not improving.
